# Patient Record
Sex: Male | Born: 1945 | Race: White | Hispanic: No | Marital: Single | Smoking: Former smoker
Health system: Southern US, Community
[De-identification: ages and names within clinical notes are randomized; demographics above are authoritative.]

## PROBLEM LIST (undated history)

## (undated) DIAGNOSIS — I4891 Unspecified atrial fibrillation: Secondary | ICD-10-CM

## (undated) DIAGNOSIS — I499 Cardiac arrhythmia, unspecified: Secondary | ICD-10-CM

## (undated) DIAGNOSIS — I2510 Atherosclerotic heart disease of native coronary artery without angina pectoris: Secondary | ICD-10-CM

## (undated) DIAGNOSIS — I429 Cardiomyopathy, unspecified: Secondary | ICD-10-CM

## (undated) DIAGNOSIS — I739 Peripheral vascular disease, unspecified: Secondary | ICD-10-CM

## (undated) DIAGNOSIS — E785 Hyperlipidemia, unspecified: Secondary | ICD-10-CM

## (undated) DIAGNOSIS — I10 Essential (primary) hypertension: Secondary | ICD-10-CM

## (undated) DIAGNOSIS — Z9889 Other specified postprocedural states: Secondary | ICD-10-CM

## (undated) DIAGNOSIS — I340 Nonrheumatic mitral (valve) insufficiency: Secondary | ICD-10-CM

## (undated) HISTORY — DX: Unspecified atrial fibrillation: I48.91

## (undated) HISTORY — PX: REPAIR QUADRICEPS / HAMSTRING MUSCLE: SUR1204

## (undated) HISTORY — DX: Atherosclerotic heart disease of native coronary artery without angina pectoris: I25.10

## (undated) HISTORY — DX: Peripheral vascular disease, unspecified: I73.9

## (undated) HISTORY — PX: HERNIA REPAIR: SHX51

## (undated) HISTORY — DX: Nonrheumatic mitral (valve) insufficiency: I34.0

## (undated) HISTORY — DX: Cardiomyopathy, unspecified: I42.9

## (undated) HISTORY — PX: CARDIAC CATHETERIZATION: SHX172

## (undated) HISTORY — DX: Other specified postprocedural states: Z98.890

## (undated) HISTORY — DX: Hyperlipidemia, unspecified: E78.5

---

## 2016-07-30 DIAGNOSIS — Z9889 Other specified postprocedural states: Secondary | ICD-10-CM

## 2016-07-30 DIAGNOSIS — I340 Nonrheumatic mitral (valve) insufficiency: Secondary | ICD-10-CM

## 2016-07-30 HISTORY — DX: Other specified postprocedural states: Z98.890

## 2016-07-30 HISTORY — DX: Nonrheumatic mitral (valve) insufficiency: I34.0

## 2016-08-11 ENCOUNTER — Encounter: Payer: Self-pay | Admitting: Cardiology

## 2016-08-11 ENCOUNTER — Encounter: Payer: Self-pay | Admitting: Thoracic Surgery (Cardiothoracic Vascular Surgery)

## 2016-08-14 ENCOUNTER — Encounter: Payer: Self-pay | Admitting: *Deleted

## 2016-08-14 ENCOUNTER — Institutional Professional Consult (permissible substitution) (INDEPENDENT_AMBULATORY_CARE_PROVIDER_SITE_OTHER): Payer: Medicare Other | Admitting: Thoracic Surgery (Cardiothoracic Vascular Surgery)

## 2016-08-14 ENCOUNTER — Encounter: Payer: Self-pay | Admitting: Thoracic Surgery (Cardiothoracic Vascular Surgery)

## 2016-08-14 VITALS — BP 128/86 | HR 116 | Resp 16 | Ht 73.0 in | Wt 213.0 lb

## 2016-08-14 DIAGNOSIS — I739 Peripheral vascular disease, unspecified: Secondary | ICD-10-CM

## 2016-08-14 DIAGNOSIS — E785 Hyperlipidemia, unspecified: Secondary | ICD-10-CM | POA: Insufficient documentation

## 2016-08-14 DIAGNOSIS — I429 Cardiomyopathy, unspecified: Secondary | ICD-10-CM | POA: Insufficient documentation

## 2016-08-14 DIAGNOSIS — I2510 Atherosclerotic heart disease of native coronary artery without angina pectoris: Secondary | ICD-10-CM

## 2016-08-14 DIAGNOSIS — I340 Nonrheumatic mitral (valve) insufficiency: Secondary | ICD-10-CM

## 2016-08-14 DIAGNOSIS — I10 Essential (primary) hypertension: Secondary | ICD-10-CM | POA: Insufficient documentation

## 2016-08-14 DIAGNOSIS — I5022 Chronic systolic (congestive) heart failure: Secondary | ICD-10-CM

## 2016-08-14 DIAGNOSIS — I4819 Other persistent atrial fibrillation: Secondary | ICD-10-CM | POA: Insufficient documentation

## 2016-08-14 NOTE — Patient Instructions (Signed)
Stop plavix 5 days prior to surgery  Start 1 baby aspirin (81 mg) daily when you stop plavix  Stop Eliquis (apixaban) 2 days prior to surgery

## 2016-08-14 NOTE — Progress Notes (Signed)
PCP is Kristen Loader, Tor Netters., MD Referring Provider is Caryl Ada, MD  Chief Complaint  Patient presents with  . Coronary Artery Disease    Surgical eval, Cardiac Cath and ECHO 07/30/2016 @ [facility]    HPI: [name] Green is referred for evaluation of coronary artery disease.  [name] Green is a 71 year old gentleman with no prior cardiac history. He does have a history of hypertension, hyperlipidemia and tobacco abuse. He also has known peripheral arterial disease and has had a stent placed in his left leg by Dr. Sondra Come in [facility].  He was in his usual state of health until early 2018 when he started noticing shortness of breath with exertion. He got to the point that even walking to his mailbox he would get severely short of breath like he had just run a race. In May he went to the hospital in [place] with severe shortness of breath. He was found to be in atrial flutter with a rapid ventricular response. His troponin was mildly elevated at 0.1. He was given metoprolol and Lasix and apparently signed out AMA at that time. He was sent to Dr. Red Christians for further evaluation. He was started on Eliquis and his metoprolol was increased to 50 mg daily. His symptoms improved after that. He still has shortness of breath on exertion, but can walk half a mile before it occurs.  He had an echocardiogram on 07/30/2016 which showed moderate to severe mitral regurgitation with thickened mitral valve leaflets. Ejection fraction was approximately 30% with inferior akinesis and global hypokinesis. Cardiac catheterization on the same day revealed severe two-vessel coronary disease with an 80% LAD stenosis in the region of the take off 2 diagonal branches and a 70% stenosis in the second diagonal. There was subtotal occlusion of the right coronary with only a small posterior descending visible distally.  He and his family. Her different opinions on bypass surgery versus stenting in [place] and wished to come for a second  opinion.   Past Medical History:  Diagnosis Date  . A-fib (HCC)    persistent  . CAD, multiple vessel   . Cardiomyopathy (HCC)   . Hyperlipidemia   . Mitral regurgitation 07/30/2016   ECHO  . PAD (peripheral artery disease) (HCC)    FOLLOWED BY DR. Sondra Come, ON PLAVIX  . S/P cardiac cath 07/30/2016    No past surgical history on file. Hernia repair Stent in left leg artery  No family history on file. Father died of cancer Mother stroke Brother stroke  Social History Social History  Substance Use Topics  . Smoking status: Former Smoker    Packs/day: 1.00    Years: 55.00    Types: Cigarettes  . Smokeless tobacco: Never Used  . Alcohol use Not on file    Current Outpatient Prescriptions  Medication Sig Dispense Refill  . amLODipine (NORVASC) 5 MG tablet Take 5 mg by mouth daily.    Marland Kitchen apixaban (ELIQUIS) 5 MG TABS tablet Take 5 mg by mouth 2 (two) times daily.    Marland Kitchen atorvastatin (LIPITOR) 40 MG tablet Take 40 mg by mouth daily.    . clopidogrel (PLAVIX) 75 MG tablet Take 75 mg by mouth daily.    Marland Kitchen KRILL OIL PO Take 500 mg by mouth daily.     Marland Kitchen lisinopril (PRINIVIL,ZESTRIL) 10 MG tablet Take 10 mg by mouth daily.    . metoprolol succinate (TOPROL-XL) 50 MG 24 hr tablet Take 50 mg by mouth daily. Take with or immediately following  a meal.     No current facility-administered medications for this visit.     No Known Allergies  Review of Systems  Constitutional: Positive for fatigue. Negative for activity change and unexpected weight change.  HENT: Negative for dental problem, trouble swallowing and voice change.   Eyes: Negative for visual disturbance.  Respiratory: Positive for shortness of breath. Negative for chest tightness.   Cardiovascular: Negative for chest pain, palpitations and leg swelling.       Varicose veins right leg  Gastrointestinal: Negative for abdominal pain and blood in stool.  Genitourinary: Negative for difficulty urinating and dysuria.   Musculoskeletal: Negative for arthralgias and myalgias.  Neurological: Negative for dizziness, syncope and weakness.  Hematological: Bruises/bleeds easily.  All other systems reviewed and are negative.   BP 128/86 (BP Location: Right Arm, Patient Position: Sitting, Cuff Size: Large)   Pulse (!) 116   Resp 16   Ht 6\' 1"  (1.854 m)   Wt 213 lb (96.6 kg)   SpO2 98% Comment: RA  BMI 28.10 kg/m  Physical Exam  Constitutional: He is oriented to person, place, and time. He appears well-developed and well-nourished. No distress.  HENT:  Head: Normocephalic and atraumatic.  Mouth/Throat: No oropharyngeal exudate.  Eyes: Conjunctivae and EOM are normal. No scleral icterus.  Neck: Neck supple. No thyromegaly present.  Cardiovascular: Exam reveals no friction rub.   Murmur (Faint systolic) heard. Irregularly irregular  Pulmonary/Chest: Effort normal and breath sounds normal. No respiratory distress. He has no wheezes. He has no rales.  Abdominal: Soft. He exhibits no distension. There is no tenderness.  Musculoskeletal: He exhibits no edema.  Varicosities right thigh  Lymphadenopathy:    He has no cervical adenopathy.  Neurological: He is alert and oriented to person, place, and time. No cranial nerve deficit. He exhibits normal muscle tone.  Motor 5 out of 5 all extremities  Skin: Skin is warm and dry.  Vitals reviewed.    Diagnostic Tests: I personally reviewed the cardiac catheterization and echocardiogram images and concur with the official reports from Dr. Red Christianshui as noted below  Cardiac catheterization 1. Severe RCA/LAD lesions, LAD bifurcating with moderate size diagonal which also has a lesion and it. 2. Moderate global LV dysfunction  LAD 80% stenosis in mid LAD 70% stenosis second diagonal RCA subtotal occlusion  Echocardiogram Left ventricular cavity size is normal. Moderate global LV hypokinesis with inferior basal wall akinesis Ejection fraction is visually estimated  at 30% Moderate to severe mitral regurgitation Normal right ventricular structure and function  Also noted moderate thickening of mitral valve leaflets  Impression: [name] Green is a 71 year old man with multiple cardiac risk factors who presented with shortness of breath on exertion. Workup revealed severe two-vessel coronary disease with ischemic cardiomyopathy, mitral regurgitation, and atrial flutter with a rapid ventricular response. His symptoms, but not completely resolved, have improved with medical therapy.  He has gone from class IV to class II symptoms.  Options include medical therapy, limited revascularization with angioplasty, or surgical treatment. Should he opt for surgery I think there is a high probability he would need the mitral valve addressed at the time of the procedure. I also would recommend doing a Maze procedure if he were to undergo surgical revascularization.  I described the proposed surgical procedure of coronary bypass grafting, possible mitral valve repair, maze procedure, possible left radial artery harvest to [name] Green and his family. I informed him of the general nature of the procedure, the need for general anesthesia,  the use of cardioplegic bypass, the expected hospital stay, and the overall recovery. They understand this is a major operation with potential for major morbidity or mortality. I informed him of the indications, risks, benefits, and alternatives. They understand the risk include, but are not limited to death, MI, DVT, PE, bleeding, possible need for transfusion, infection, cardiac arrhythmias, heart block, respiratory or renal failure, as well as the possibility of other unforeseeable complications. They understand that Maze procedure as effective approximately 85% of the time for treatment of atrial fibrillation. He will need to remain on anticoagulation in the near term.  They are anxious to proceed with surgical revascularization and correction of mitral  regurgitation and atrial fibrillation.  Plan: See dentist for dental evaluation Stop Plavix 5 days prior to surgery and start aspirin 81 mg a day Stop Eliquis 2 days prior to surgery Coronary bypass graft 3, possible left radial artery harvest, mitral valve repair, Maze procedure on Wednesday, 09/17/2016  Loreli SlotSteven C Effrey Davidow, MD [facility] [phone]  Addendum He also was seen [facility] within the past couple of days for an abdominal ultrasound to evaluate elevated liver function testing. There was a question of 2 small hypoechoic areas in the pancreas. He did not inform you that while he was here his visit and I found those records through care everywhere.

## 2016-08-15 ENCOUNTER — Other Ambulatory Visit: Payer: Self-pay | Admitting: *Deleted

## 2016-08-15 DIAGNOSIS — I340 Nonrheumatic mitral (valve) insufficiency: Secondary | ICD-10-CM

## 2016-08-15 DIAGNOSIS — I2510 Atherosclerotic heart disease of native coronary artery without angina pectoris: Secondary | ICD-10-CM

## 2016-08-15 DIAGNOSIS — I4819 Other persistent atrial fibrillation: Secondary | ICD-10-CM

## 2016-08-26 ENCOUNTER — Telehealth: Payer: Self-pay

## 2016-08-26 NOTE — Telephone Encounter (Signed)
Mr [name] Green is scheduled to have 2 teeth extracted next week and was needing to know about when to hold his blood thinners. I recommended that he call the prescribing MD for that information. He agreed and will call Dr Ninetta LightsSara Furr

## 2016-09-12 ENCOUNTER — Ambulatory Visit (HOSPITAL_COMMUNITY)
Admission: RE | Admit: 2016-09-12 | Discharge: 2016-09-12 | Disposition: A | Discharge status: Home / Self Care | Payer: Medicare Other | Source: Ambulatory Visit | Attending: Thoracic Surgery (Cardiothoracic Vascular Surgery) | Admitting: Thoracic Surgery (Cardiothoracic Vascular Surgery)

## 2016-09-12 ENCOUNTER — Encounter (HOSPITAL_COMMUNITY)
Admission: RE | Admit: 2016-09-12 | Discharge: 2016-09-12 | Disposition: A | Discharge status: Home / Self Care | Payer: Medicare Other | Source: Ambulatory Visit | Attending: Thoracic Surgery (Cardiothoracic Vascular Surgery) | Admitting: Thoracic Surgery (Cardiothoracic Vascular Surgery)

## 2016-09-12 ENCOUNTER — Encounter (HOSPITAL_COMMUNITY): Payer: Self-pay

## 2016-09-12 DIAGNOSIS — I10 Essential (primary) hypertension: Secondary | ICD-10-CM | POA: Insufficient documentation

## 2016-09-12 DIAGNOSIS — I4819 Other persistent atrial fibrillation: Secondary | ICD-10-CM

## 2016-09-12 DIAGNOSIS — I2510 Atherosclerotic heart disease of native coronary artery without angina pectoris: Secondary | ICD-10-CM | POA: Insufficient documentation

## 2016-09-12 DIAGNOSIS — Z951 Presence of aortocoronary bypass graft: Secondary | ICD-10-CM | POA: Diagnosis not present

## 2016-09-12 DIAGNOSIS — R918 Other nonspecific abnormal finding of lung field: Secondary | ICD-10-CM | POA: Insufficient documentation

## 2016-09-12 DIAGNOSIS — I481 Persistent atrial fibrillation: Secondary | ICD-10-CM | POA: Insufficient documentation

## 2016-09-12 DIAGNOSIS — I340 Nonrheumatic mitral (valve) insufficiency: Secondary | ICD-10-CM

## 2016-09-12 HISTORY — DX: Cardiac arrhythmia, unspecified: I49.9

## 2016-09-12 HISTORY — DX: Essential (primary) hypertension: I10

## 2016-09-12 LAB — HEMOGLOBIN A1C
HEMOGLOBIN A1C: 6.1 % — AB (ref 4.8–5.6)
Mean Plasma Glucose: 128.37 mg/dL

## 2016-09-12 LAB — VAS US DOPPLER PRE CABG
LCCADDIAS: 15 cm/s
LCCAPDIAS: 32 cm/s
LCCAPSYS: 104 cm/s
LEFT ECA DIAS: -27 cm/s
LEFT VERTEBRAL DIAS: -18 cm/s
LICADDIAS: -40 cm/s
LICADSYS: -86 cm/s
LICAPSYS: -47 cm/s
Left CCA dist sys: 50 cm/s
Left ICA prox dias: -23 cm/s
RIGHT ECA DIAS: -25 cm/s
RIGHT VERTEBRAL DIAS: -25 cm/s
Right CCA prox dias: 24 cm/s
Right CCA prox sys: 72 cm/s
Right cca dist sys: -79 cm/s

## 2016-09-12 LAB — CBC
HEMATOCRIT: 42.2 % (ref 39.0–52.0)
Hemoglobin: 13.2 g/dL (ref 13.0–17.0)
MCH: 26.9 pg (ref 26.0–34.0)
MCHC: 31.3 g/dL (ref 30.0–36.0)
MCV: 86.1 fL (ref 78.0–100.0)
PLATELETS: 152 10*3/uL (ref 150–400)
RBC: 4.9 MIL/uL (ref 4.22–5.81)
RDW: 15.3 % (ref 11.5–15.5)
WBC: 5.7 10*3/uL (ref 4.0–10.5)

## 2016-09-12 LAB — PULMONARY FUNCTION TEST
DL/VA % PRED: 74 %
DL/VA: 3.54 ml/min/mmHg/L
DLCO UNC % PRED: 49 %
DLCO unc: 18.18 ml/min/mmHg
FEF 25-75 POST: 0.95 L/s
FEF 25-75 PRE: 0.97 L/s
FEF2575-%Change-Post: -2 %
FEF2575-%PRED-PRE: 37 %
FEF2575-%Pred-Post: 36 %
FEV1-%CHANGE-POST: 0 %
FEV1-%Pred-Post: 52 %
FEV1-%Pred-Pre: 52 %
FEV1-Post: 1.85 L
FEV1-Pre: 1.85 L
FEV1FVC-%Change-Post: -1 %
FEV1FVC-%PRED-PRE: 84 %
FEV6-%CHANGE-POST: 1 %
FEV6-%PRED-POST: 64 %
FEV6-%Pred-Pre: 63 %
FEV6-Post: 2.92 L
FEV6-Pre: 2.89 L
FEV6FVC-%CHANGE-POST: 0 %
FEV6FVC-%Pred-Post: 102 %
FEV6FVC-%Pred-Pre: 103 %
FVC-%Change-Post: 1 %
FVC-%PRED-POST: 62 %
FVC-%Pred-Pre: 61 %
FVC-Post: 3.03 L
FVC-Pre: 2.98 L
Post FEV1/FVC ratio: 61 %
Post FEV6/FVC ratio: 96 %
Pre FEV1/FVC ratio: 62 %
Pre FEV6/FVC Ratio: 97 %
RV % pred: 131 %
RV: 3.53 L
TLC % pred: 85 %
TLC: 6.53 L

## 2016-09-12 LAB — URINALYSIS, ROUTINE W REFLEX MICROSCOPIC
BILIRUBIN URINE: NEGATIVE
GLUCOSE, UA: NEGATIVE mg/dL
HGB URINE DIPSTICK: NEGATIVE
KETONES UR: NEGATIVE mg/dL
Leukocytes, UA: NEGATIVE
Nitrite: NEGATIVE
PROTEIN: NEGATIVE mg/dL
Specific Gravity, Urine: 1.012 (ref 1.005–1.030)
pH: 6 (ref 5.0–8.0)

## 2016-09-12 LAB — COMPREHENSIVE METABOLIC PANEL
ALBUMIN: 3.9 g/dL (ref 3.5–5.0)
ALT: 30 U/L (ref 17–63)
AST: 31 U/L (ref 15–41)
Alkaline Phosphatase: 93 U/L (ref 38–126)
Anion gap: 9 (ref 5–15)
BUN: 17 mg/dL (ref 6–20)
CHLORIDE: 106 mmol/L (ref 101–111)
CO2: 24 mmol/L (ref 22–32)
CREATININE: 1.11 mg/dL (ref 0.61–1.24)
Calcium: 9.2 mg/dL (ref 8.9–10.3)
GFR calc Af Amer: >60 mL/min (ref >60)
GLUCOSE: 91 mg/dL (ref 65–99)
Potassium: 4 mmol/L (ref 3.5–5.1)
Sodium: 139 mmol/L (ref 135–145)
Total Bilirubin: 1.1 mg/dL (ref 0.3–1.2)
Total Protein: 6.5 g/dL (ref 6.5–8.1)

## 2016-09-12 LAB — BLOOD GAS, ARTERIAL
Acid-Base Excess: 0.5 mmol/L (ref 0.0–2.0)
Bicarbonate: 24.5 mmol/L (ref 20.0–28.0)
DRAWN BY: 470591
FIO2: 21
O2 SAT: 97.1 %
PCO2 ART: 38.7 mmHg (ref 32.0–48.0)
PH ART: 7.417 (ref 7.350–7.450)
Patient temperature: 98.6
pO2, Arterial: 88.8 mmHg (ref 83.0–108.0)

## 2016-09-12 LAB — ABO/RH: ABO/RH(D): O NEG

## 2016-09-12 LAB — TYPE AND SCREEN
ABO/RH(D): O NEG
ANTIBODY SCREEN: NEGATIVE

## 2016-09-12 LAB — APTT: aPTT: 42 seconds — ABNORMAL HIGH (ref 24–36)

## 2016-09-12 LAB — SURGICAL PCR SCREEN
MRSA, PCR: NEGATIVE
Staphylococcus aureus: NEGATIVE

## 2016-09-12 LAB — PROTIME-INR
INR: 1.24
Prothrombin Time: 15.5 seconds — ABNORMAL HIGH (ref 11.4–15.2)

## 2016-09-12 MED ORDER — ALBUTEROL SULFATE (2.5 MG/3ML) 0.083% IN NEBU
2.5000 mg | INHALATION_SOLUTION | Freq: Once | RESPIRATORY_TRACT | Status: AC
  Administered 2016-09-12: 2.5 mg via RESPIRATORY_TRACT

## 2016-09-12 NOTE — Pre-Procedure Instructions (Signed)
[name] Green  09/12/2016      [id number] - [place], [place] - [address] [facility] [id number] Phone: [phone] Fax: [phone]    Your procedure is scheduled on Sept 5  Report to [facility] Admitting at 630 A.M.  Call this number if you have problems the morning of surgery:  [phone]   Remember:  Do not eat food or drink liquids after midnight.  Take these medicines the morning of surgery with A SIP OF WATER amlodipine (Norvasc), Metoprolol succinate (Toprol-XL)  Stop Eliquis and Plavix as directed by your Dr.  Stop taking BC's, Goody's, Herbal medications, Fish Oil, Vitamins, Ibuprofen, Advil, Motrin, aleve   Do not wear jewelry, make-up or nail polish.  Do not wear lotions, powders, or perfumes, or deoderant.  Do not shave 48 hours prior to surgery.  Men may shave face and neck.  Do not bring valuables to the hospital.  [facility] is not responsible for any belongings or valuables.  Contacts, dentures or bridgework may not be worn into surgery.  Leave your suitcase in the car.  After surgery it may be brought to your room.  For patients admitted to the hospital, discharge time will be determined by your treatment team.  Patients discharged the day of surgery will not be allowed to drive home.    Special instructions:  [place] - Preparing for Surgery  Before surgery, you can play an important role.  Because skin is not sterile, your skin needs to be as free of germs as possible.  You can reduce the number of germs on you skin by washing with CHG (chlorahexidine gluconate) soap before surgery.  CHG is an antiseptic cleaner which kills germs and bonds with the skin to continue killing germs even after washing.  Please DO NOT use if you have an allergy to CHG or antibacterial soaps.  If your skin becomes reddened/irritated stop using the CHG and inform your nurse when you arrive at Short Stay.  Do not shave  (including legs and underarms) for at least 48 hours prior to the first CHG shower.  You may shave your face.  Please follow these instructions carefully:   1.  Shower with CHG Soap the night before surgery and the   morning of Surgery.  2.  If you choose to wash your hair, wash your hair first as usual with your  normal shampoo.  3.  After you shampoo, rinse your hair and body thoroughly to remove the Shampoo.  4.  Use CHG as you would any other liquid soap.  You can apply chg directly to the skin and wash gently with scrungie or a clean washcloth.  5.  Apply the CHG Soap to your body ONLY FROM THE NECK DOWN.  Do not use on open wounds or open sores.  Avoid contact with your eyes,  ears, mouth and genitals (private parts).  Wash genitals (private parts)  with your normal soap.  6.  Wash thoroughly, paying special attention to the area where your surgery  will be performed.  7.  Thoroughly rinse your body with warm water from the neck down.  8.  DO NOT shower/wash with your normal soap after using and rinsing off  the CHG Soap.  9.  Pat yourself dry with a clean towel.            10.  Wear clean pajamas.  11.  Place clean sheets on your bed the night of your first shower and do not  sleep with pets.  Day of Surgery  Do not apply any lotions/deoderants the morning of surgery.  Please wear clean clothes to the hospital/surgery center.     Please read over the following fact sheets that you were given. Pain Booklet, Coughing and Deep Breathing, MRSA Information and Surgical Site Infection Prevention

## 2016-09-12 NOTE — Progress Notes (Signed)
Pre-op Cardiac Surgery  Carotid Findings:  Bilateral 1-39% ICA stenosis, antegrade vertebral flow.   Upper Extremity Right Left  Brachial Pressures 132, Tri 129, Tri  Radial Waveforms Tri Tri  Ulnar Waveforms Tri Tri  Palmar Arch (Allen's Test) waveform decreases greater than 50% with radial and ulnar compression. waveform increases greater than 50% with radial compression and reverses with ulnar compression   Lower  Extremity Right Left  Dorsalis Pedis 133, Bi 119, Bi  Posterior Tibial 141, Bi 130, Bi  Ankle/Brachial Indices 1.07 0.98   Levin BaconClaire Kevaughn Green- RDMS, RVT 10:38 AM  09/12/2016

## 2016-09-12 NOTE — Progress Notes (Addendum)
PCP is Dr. Ninetta LightsSara Furr Cardiologist is [facility]  Denies any chest pain, fever, or cough. Echo 08-15-16 Card cath 07-30-16 Denies ever having a stress test.  Reports his last dose of plavix will be 09-13-16 Last dose of Eliquis will be 09-15-16 He will start taking ASA as directed once he stops Plavix.

## 2016-09-12 NOTE — Progress Notes (Addendum)
Message left with Alycia RossettiRyan to inform her of PTT of 42. Alycia RossettiRyan also informed of CXR report.

## 2016-09-16 MED ORDER — VANCOMYCIN HCL 10 G IV SOLR
1500.0000 mg | INTRAVENOUS | Status: AC
  Administered 2016-09-17: 1500 mg via INTRAVENOUS
  Filled 2016-09-16: qty 1500

## 2016-09-16 MED ORDER — SODIUM CHLORIDE 0.9 % IV SOLN
INTRAVENOUS | Status: DC
  Filled 2016-09-16: qty 30

## 2016-09-16 MED ORDER — CEFUROXIME SODIUM 750 MG IJ SOLR
750.0000 mg | INTRAMUSCULAR | Status: DC
  Filled 2016-09-16: qty 750

## 2016-09-16 MED ORDER — DEXTROSE 5 % IV SOLN
1.5000 g | INTRAVENOUS | Status: AC
  Administered 2016-09-17: 1.5 g via INTRAVENOUS
  Administered 2016-09-17: .75 g via INTRAVENOUS
  Filled 2016-09-16: qty 1.5

## 2016-09-16 MED ORDER — DOPAMINE-DEXTROSE 3.2-5 MG/ML-% IV SOLN
0.0000 ug/kg/min | INTRAVENOUS | Status: AC
  Administered 2016-09-17: 3 ug/kg/min via INTRAVENOUS
  Filled 2016-09-16: qty 250

## 2016-09-16 MED ORDER — POTASSIUM CHLORIDE 2 MEQ/ML IV SOLN
80.0000 meq | INTRAVENOUS | Status: DC
  Filled 2016-09-16: qty 40

## 2016-09-16 MED ORDER — PLASMA-LYTE 148 IV SOLN
INTRAVENOUS | Status: AC
  Administered 2016-09-17: 500 mL
  Filled 2016-09-16: qty 2.5

## 2016-09-16 MED ORDER — SODIUM CHLORIDE 0.9 % IV SOLN
INTRAVENOUS | Status: AC
  Administered 2016-09-17: 1 [IU]/h via INTRAVENOUS
  Filled 2016-09-16: qty 1

## 2016-09-16 MED ORDER — TRANEXAMIC ACID 1000 MG/10ML IV SOLN
1.5000 mg/kg/h | INTRAVENOUS | Status: AC
  Administered 2016-09-17: 1.5 mg/kg/h via INTRAVENOUS
  Filled 2016-09-16: qty 25

## 2016-09-16 MED ORDER — DEXMEDETOMIDINE HCL IN NACL 400 MCG/100ML IV SOLN
0.1000 ug/kg/h | INTRAVENOUS | Status: AC
  Administered 2016-09-17: .3 ug/kg/h via INTRAVENOUS
  Filled 2016-09-16: qty 100

## 2016-09-16 MED ORDER — PHENYLEPHRINE HCL 10 MG/ML IJ SOLN
30.0000 ug/min | INTRAMUSCULAR | Status: AC
  Administered 2016-09-17: 20 ug/min via INTRAVENOUS
  Filled 2016-09-16: qty 2

## 2016-09-16 MED ORDER — TRANEXAMIC ACID (OHS) PUMP PRIME SOLUTION
2.0000 mg/kg | INTRAVENOUS | Status: DC
  Filled 2016-09-16: qty 1.91

## 2016-09-16 MED ORDER — DEXTROSE 5 % IV SOLN
0.0000 ug/min | INTRAVENOUS | Status: DC
  Filled 2016-09-16: qty 4

## 2016-09-16 MED ORDER — MAGNESIUM SULFATE 50 % IJ SOLN
40.0000 meq | INTRAMUSCULAR | Status: DC
  Filled 2016-09-16: qty 10

## 2016-09-16 MED ORDER — TRANEXAMIC ACID (OHS) BOLUS VIA INFUSION
15.0000 mg/kg | INTRAVENOUS | Status: AC
  Administered 2016-09-17: 1429.5 mg via INTRAVENOUS
  Filled 2016-09-16: qty 1430

## 2016-09-16 MED ORDER — NITROGLYCERIN IN D5W 200-5 MCG/ML-% IV SOLN
2.0000 ug/min | INTRAVENOUS | Status: AC
  Administered 2016-09-17: 5 ug/min via INTRAVENOUS
  Filled 2016-09-16: qty 250

## 2016-09-16 MED ORDER — METOPROLOL TARTRATE 12.5 MG HALF TABLET: 12.5000 mg | ORAL_TABLET | Freq: Once | ORAL | Status: DC

## 2016-09-17 ENCOUNTER — Inpatient Hospital Stay (HOSPITAL_COMMUNITY): Payer: Medicare Other | Admitting: Certified Registered Nurse Anesthetist

## 2016-09-17 ENCOUNTER — Encounter (HOSPITAL_COMMUNITY): Payer: Self-pay | Admitting: *Deleted

## 2016-09-17 ENCOUNTER — Inpatient Hospital Stay (HOSPITAL_COMMUNITY): Payer: Medicare Other

## 2016-09-17 ENCOUNTER — Inpatient Hospital Stay (HOSPITAL_COMMUNITY)
Admission: RE | Admit: 2016-09-17 | Discharge: 2016-09-24 | DRG: 220 | Disposition: A | Discharge status: Home / Self Care | Payer: Medicare Other | Source: Ambulatory Visit | Attending: Thoracic Surgery (Cardiothoracic Vascular Surgery) | Admitting: Thoracic Surgery (Cardiothoracic Vascular Surgery)

## 2016-09-17 ENCOUNTER — Inpatient Hospital Stay (HOSPITAL_COMMUNITY): Payer: Medicare Other | Admitting: Vascular Surgery

## 2016-09-17 ENCOUNTER — Inpatient Hospital Stay (HOSPITAL_COMMUNITY)
Admission: RE | Disposition: A | Discharge status: Home / Self Care | Payer: Self-pay | Source: Ambulatory Visit | Attending: Thoracic Surgery (Cardiothoracic Vascular Surgery)

## 2016-09-17 DIAGNOSIS — D696 Thrombocytopenia, unspecified: Secondary | ICD-10-CM | POA: Diagnosis not present

## 2016-09-17 DIAGNOSIS — Z823 Family history of stroke: Secondary | ICD-10-CM

## 2016-09-17 DIAGNOSIS — I255 Ischemic cardiomyopathy: Secondary | ICD-10-CM | POA: Diagnosis not present

## 2016-09-17 DIAGNOSIS — Z7901 Long term (current) use of anticoagulants: Secondary | ICD-10-CM

## 2016-09-17 DIAGNOSIS — I5022 Chronic systolic (congestive) heart failure: Secondary | ICD-10-CM | POA: Diagnosis present

## 2016-09-17 DIAGNOSIS — I2510 Atherosclerotic heart disease of native coronary artery without angina pectoris: Secondary | ICD-10-CM

## 2016-09-17 DIAGNOSIS — Z9889 Other specified postprocedural states: Secondary | ICD-10-CM

## 2016-09-17 DIAGNOSIS — I340 Nonrheumatic mitral (valve) insufficiency: Secondary | ICD-10-CM | POA: Diagnosis not present

## 2016-09-17 DIAGNOSIS — R112 Nausea with vomiting, unspecified: Secondary | ICD-10-CM | POA: Diagnosis not present

## 2016-09-17 DIAGNOSIS — Z87891 Personal history of nicotine dependence: Secondary | ICD-10-CM

## 2016-09-17 DIAGNOSIS — Z951 Presence of aortocoronary bypass graft: Secondary | ICD-10-CM

## 2016-09-17 DIAGNOSIS — I480 Paroxysmal atrial fibrillation: Secondary | ICD-10-CM | POA: Diagnosis not present

## 2016-09-17 DIAGNOSIS — I4819 Other persistent atrial fibrillation: Secondary | ICD-10-CM

## 2016-09-17 DIAGNOSIS — J9811 Atelectasis: Secondary | ICD-10-CM

## 2016-09-17 DIAGNOSIS — I110 Hypertensive heart disease with heart failure: Secondary | ICD-10-CM | POA: Diagnosis not present

## 2016-09-17 DIAGNOSIS — Z7902 Long term (current) use of antithrombotics/antiplatelets: Secondary | ICD-10-CM | POA: Diagnosis not present

## 2016-09-17 DIAGNOSIS — E785 Hyperlipidemia, unspecified: Secondary | ICD-10-CM | POA: Diagnosis present

## 2016-09-17 DIAGNOSIS — I481 Persistent atrial fibrillation: Secondary | ICD-10-CM | POA: Diagnosis present

## 2016-09-17 HISTORY — PX: CORONARY ARTERY BYPASS GRAFT: SHX141

## 2016-09-17 HISTORY — PX: MAZE: SHX5063

## 2016-09-17 HISTORY — PX: MITRAL VALVE REPAIR: SHX2039

## 2016-09-17 HISTORY — PX: TEE WITHOUT CARDIOVERSION: SHX5443

## 2016-09-17 LAB — POCT I-STAT 3, ART BLOOD GAS (G3+)
ACID-BASE DEFICIT: 3 mmol/L — AB (ref 0.0–2.0)
ACID-BASE DEFICIT: 5 mmol/L — AB (ref 0.0–2.0)
ACID-BASE DEFICIT: 4 mmol/L — AB (ref 0.0–2.0)
ACID-BASE EXCESS: 4 mmol/L — AB (ref 0.0–2.0)
Acid-Base Excess: 1 mmol/L (ref 0.0–2.0)
Acid-base deficit: 5 mmol/L — ABNORMAL HIGH (ref 0.0–2.0)
Acid-base deficit: 5 mmol/L — ABNORMAL HIGH (ref 0.0–2.0)
Acid-base deficit: 5 mmol/L — ABNORMAL HIGH (ref 0.0–2.0)
BICARBONATE: 21.3 mmol/L (ref 20.0–28.0)
BICARBONATE: 22 mmol/L (ref 20.0–28.0)
BICARBONATE: 23.1 mmol/L (ref 20.0–28.0)
BICARBONATE: 29.7 mmol/L — AB (ref 20.0–28.0)
Bicarbonate: 27.7 mmol/L (ref 20.0–28.0)
Bicarbonate: 22.8 mmol/L (ref 20.0–28.0)
Bicarbonate: 22.3 mmol/L (ref 20.0–28.0)
Bicarbonate: 21.8 mmol/L (ref 20.0–28.0)
O2 SAT: 100 %
O2 SAT: 100 %
O2 SAT: 89 %
O2 SAT: 95 %
O2 SAT: 95 %
O2 SAT: 92 %
O2 Saturation: 87 %
O2 Saturation: 91 %
PCO2 ART: 42.4 mmHg (ref 32.0–48.0)
PCO2 ART: 42.6 mmHg (ref 32.0–48.0)
PCO2 ART: 43.7 mmHg (ref 32.0–48.0)
PCO2 ART: 50.6 mmHg — AB (ref 32.0–48.0)
PCO2 ART: 52.7 mmHg — AB (ref 32.0–48.0)
PCO2 ART: 54.8 mmHg — AB (ref 32.0–48.0)
PH ART: 7.312 — AB (ref 7.350–7.450)
PH ART: 7.257 — AB (ref 7.350–7.450)
PH ART: 7.287 — AB (ref 7.350–7.450)
PH ART: 7.322 — AB (ref 7.350–7.450)
PO2 ART: 447 mmHg — AB (ref 83.0–108.0)
PO2 ART: 78 mmHg — AB (ref 83.0–108.0)
PO2 ART: 80 mmHg — AB (ref 83.0–108.0)
PO2 ART: 68 mmHg — AB (ref 83.0–108.0)
Patient temperature: 36
Patient temperature: 36.3
Patient temperature: 36.4
Patient temperature: 36.8
Patient temperature: 37.1
TCO2: 23 mmol/L (ref 22–32)
TCO2: 23 mmol/L (ref 22–32)
TCO2: 23 mmol/L (ref 22–32)
TCO2: 24 mmol/L (ref 22–32)
TCO2: 24 mmol/L (ref 22–32)
TCO2: 24 mmol/L (ref 22–32)
TCO2: 29 mmol/L (ref 22–32)
TCO2: 31 mmol/L (ref 22–32)
pCO2 arterial: 46.3 mmHg (ref 32.0–48.0)
pCO2 arterial: 45.3 mmHg (ref 32.0–48.0)
pH, Arterial: 7.303 — ABNORMAL LOW (ref 7.350–7.450)
pH, Arterial: 7.308 — ABNORMAL LOW (ref 7.350–7.450)
pH, Arterial: 7.312 — ABNORMAL LOW (ref 7.350–7.450)
pH, Arterial: 7.359 (ref 7.350–7.450)
pO2, Arterial: 320 mmHg — ABNORMAL HIGH (ref 83.0–108.0)
pO2, Arterial: 59 mmHg — ABNORMAL LOW (ref 83.0–108.0)
pO2, Arterial: 61 mmHg — ABNORMAL LOW (ref 83.0–108.0)
pO2, Arterial: 67 mmHg — ABNORMAL LOW (ref 83.0–108.0)

## 2016-09-17 LAB — POCT I-STAT, CHEM 8
BUN: 22 mg/dL — AB (ref 6–20)
BUN: 21 mg/dL — AB (ref 6–20)
BUN: 21 mg/dL — AB (ref 6–20)
BUN: 21 mg/dL — ABNORMAL HIGH (ref 6–20)
BUN: 23 mg/dL — ABNORMAL HIGH (ref 6–20)
BUN: 23 mg/dL — ABNORMAL HIGH (ref 6–20)
BUN: 21 mg/dL — ABNORMAL HIGH (ref 6–20)
CALCIUM ION: 1.03 mmol/L — AB (ref 1.15–1.40)
CALCIUM ION: 1.11 mmol/L — AB (ref 1.15–1.40)
CALCIUM ION: 1.07 mmol/L — AB (ref 1.15–1.40)
CALCIUM ION: 1.11 mmol/L — AB (ref 1.15–1.40)
CALCIUM ION: 1.17 mmol/L (ref 1.15–1.40)
CHLORIDE: 106 mmol/L (ref 101–111)
CHLORIDE: 103 mmol/L (ref 101–111)
CHLORIDE: 106 mmol/L (ref 101–111)
CHLORIDE: 110 mmol/L (ref 101–111)
CREATININE: 0.9 mg/dL (ref 0.61–1.24)
CREATININE: 0.7 mg/dL (ref 0.61–1.24)
CREATININE: 0.8 mg/dL (ref 0.61–1.24)
CREATININE: 0.8 mg/dL (ref 0.61–1.24)
CREATININE: 1 mg/dL (ref 0.61–1.24)
Calcium, Ion: 1.18 mmol/L (ref 1.15–1.40)
Calcium, Ion: 1.23 mmol/L (ref 1.15–1.40)
Chloride: 106 mmol/L (ref 101–111)
Chloride: 105 mmol/L (ref 101–111)
Chloride: 106 mmol/L (ref 101–111)
Creatinine, Ser: 0.8 mg/dL (ref 0.61–1.24)
Creatinine, Ser: 0.8 mg/dL (ref 0.61–1.24)
GLUCOSE: 106 mg/dL — AB (ref 65–99)
GLUCOSE: 116 mg/dL — AB (ref 65–99)
GLUCOSE: 123 mg/dL — AB (ref 65–99)
GLUCOSE: 121 mg/dL — AB (ref 65–99)
GLUCOSE: 153 mg/dL — AB (ref 65–99)
Glucose, Bld: 102 mg/dL — ABNORMAL HIGH (ref 65–99)
Glucose, Bld: 147 mg/dL — ABNORMAL HIGH (ref 65–99)
HCT: 33 % — ABNORMAL LOW (ref 39.0–52.0)
HCT: 30 % — ABNORMAL LOW (ref 39.0–52.0)
HCT: 28 % — ABNORMAL LOW (ref 39.0–52.0)
HCT: 27 % — ABNORMAL LOW (ref 39.0–52.0)
HCT: 32 % — ABNORMAL LOW (ref 39.0–52.0)
HEMATOCRIT: 35 % — AB (ref 39.0–52.0)
HEMATOCRIT: 33 % — AB (ref 39.0–52.0)
HEMOGLOBIN: 10.2 g/dL — AB (ref 13.0–17.0)
HEMOGLOBIN: 9.5 g/dL — AB (ref 13.0–17.0)
Hemoglobin: 11.9 g/dL — ABNORMAL LOW (ref 13.0–17.0)
Hemoglobin: 11.2 g/dL — ABNORMAL LOW (ref 13.0–17.0)
Hemoglobin: 9.2 g/dL — ABNORMAL LOW (ref 13.0–17.0)
Hemoglobin: 10.9 g/dL — ABNORMAL LOW (ref 13.0–17.0)
Hemoglobin: 11.2 g/dL — ABNORMAL LOW (ref 13.0–17.0)
POTASSIUM: 5 mmol/L (ref 3.5–5.1)
POTASSIUM: 4.1 mmol/L (ref 3.5–5.1)
Potassium: 4 mmol/L (ref 3.5–5.1)
Potassium: 4.7 mmol/L (ref 3.5–5.1)
Potassium: 4.8 mmol/L (ref 3.5–5.1)
Potassium: 4.8 mmol/L (ref 3.5–5.1)
Potassium: 4 mmol/L (ref 3.5–5.1)
SODIUM: 141 mmol/L (ref 135–145)
SODIUM: 141 mmol/L (ref 135–145)
SODIUM: 139 mmol/L (ref 135–145)
SODIUM: 140 mmol/L (ref 135–145)
Sodium: 140 mmol/L (ref 135–145)
Sodium: 137 mmol/L (ref 135–145)
Sodium: 140 mmol/L (ref 135–145)
TCO2: 27 mmol/L (ref 22–32)
TCO2: 25 mmol/L (ref 22–32)
TCO2: 27 mmol/L (ref 22–32)
TCO2: 26 mmol/L (ref 22–32)
TCO2: 27 mmol/L (ref 22–32)
TCO2: 28 mmol/L (ref 22–32)
TCO2: 23 mmol/L (ref 22–32)

## 2016-09-17 LAB — HEMOGLOBIN AND HEMATOCRIT, BLOOD
HEMATOCRIT: 29.2 % — AB (ref 39.0–52.0)
HEMOGLOBIN: 9.4 g/dL — AB (ref 13.0–17.0)

## 2016-09-17 LAB — CBC
HEMATOCRIT: 39.7 % (ref 39.0–52.0)
HEMATOCRIT: 35.3 % — AB (ref 39.0–52.0)
HEMOGLOBIN: 12.5 g/dL — AB (ref 13.0–17.0)
HEMOGLOBIN: 11 g/dL — AB (ref 13.0–17.0)
MCH: 26.9 pg (ref 26.0–34.0)
MCH: 26.4 pg (ref 26.0–34.0)
MCHC: 31.5 g/dL (ref 30.0–36.0)
MCHC: 31.2 g/dL (ref 30.0–36.0)
MCV: 85.6 fL (ref 78.0–100.0)
MCV: 84.7 fL (ref 78.0–100.0)
PLATELETS: 136 10*3/uL — AB (ref 150–400)
Platelets: 123 10*3/uL — ABNORMAL LOW (ref 150–400)
RBC: 4.64 MIL/uL (ref 4.22–5.81)
RBC: 4.17 MIL/uL — AB (ref 4.22–5.81)
RDW: 15.3 % (ref 11.5–15.5)
RDW: 15.3 % (ref 11.5–15.5)
WBC: 16.1 10*3/uL — ABNORMAL HIGH (ref 4.0–10.5)
WBC: 14.8 10*3/uL — AB (ref 4.0–10.5)

## 2016-09-17 LAB — GLUCOSE, CAPILLARY
GLUCOSE-CAPILLARY: 132 mg/dL — AB (ref 65–99)
Glucose-Capillary: 139 mg/dL — ABNORMAL HIGH (ref 65–99)
Glucose-Capillary: 137 mg/dL — ABNORMAL HIGH (ref 65–99)
Glucose-Capillary: 124 mg/dL — ABNORMAL HIGH (ref 65–99)
Glucose-Capillary: 132 mg/dL — ABNORMAL HIGH (ref 65–99)

## 2016-09-17 LAB — POCT I-STAT 4, (NA,K, GLUC, HGB,HCT)
Glucose, Bld: 135 mg/dL — ABNORMAL HIGH (ref 65–99)
HCT: 39 % (ref 39.0–52.0)
HEMOGLOBIN: 13.3 g/dL (ref 13.0–17.0)
Potassium: 4.4 mmol/L (ref 3.5–5.1)
SODIUM: 141 mmol/L (ref 135–145)

## 2016-09-17 LAB — PROTIME-INR
INR: 0.99
INR: 1.34
PROTHROMBIN TIME: 16.5 s — AB (ref 11.4–15.2)
Prothrombin Time: 13 seconds (ref 11.4–15.2)

## 2016-09-17 LAB — APTT
APTT: 36 s (ref 24–36)
aPTT: 33 seconds (ref 24–36)

## 2016-09-17 LAB — MAGNESIUM: MAGNESIUM: 2.7 mg/dL — AB (ref 1.7–2.4)

## 2016-09-17 LAB — CK: CK TOTAL: 1184 U/L — AB (ref 49–397)

## 2016-09-17 LAB — PLATELET COUNT: PLATELETS: 105 10*3/uL — AB (ref 150–400)

## 2016-09-17 SURGERY — CORONARY ARTERY BYPASS GRAFTING (CABG)
Anesthesia: General | Site: Chest

## 2016-09-17 MED ORDER — VANCOMYCIN HCL IN DEXTROSE 1-5 GM/200ML-% IV SOLN
1000.0000 mg | Freq: Once | INTRAVENOUS | Status: AC
  Administered 2016-09-17: 1000 mg via INTRAVENOUS
  Filled 2016-09-17: qty 200

## 2016-09-17 MED ORDER — MIDAZOLAM HCL 2 MG/2ML IJ SOLN
2.0000 mg | INTRAMUSCULAR | Status: DC | PRN
  Administered 2016-09-17: 2 mg via INTRAVENOUS
  Filled 2016-09-17: qty 2

## 2016-09-17 MED ORDER — ARTIFICIAL TEARS OPHTHALMIC OINT
TOPICAL_OINTMENT | OPHTHALMIC | Status: AC
  Filled 2016-09-17: qty 3.5

## 2016-09-17 MED ORDER — SODIUM CHLORIDE 0.9 % IJ SOLN
OROMUCOSAL | Status: DC | PRN
  Administered 2016-09-17 (×3): 4 mL via TOPICAL

## 2016-09-17 MED ORDER — METOPROLOL TARTRATE 25 MG/10 ML ORAL SUSPENSION: 12.5000 mg | Freq: Two times a day (BID) | ORAL | Status: DC

## 2016-09-17 MED ORDER — AMIODARONE HCL IN DEXTROSE 360-4.14 MG/200ML-% IV SOLN
30.0000 mg/h | INTRAVENOUS | Status: DC
  Administered 2016-09-18 – 2016-09-19 (×3): 30 mg/h via INTRAVENOUS
  Filled 2016-09-17 (×3): qty 200

## 2016-09-17 MED ORDER — SODIUM CHLORIDE 0.9 % IV SOLN
INTRAVENOUS | Status: DC
  Filled 2016-09-17: qty 1

## 2016-09-17 MED ORDER — LACTATED RINGERS IV SOLN: INTRAVENOUS | Status: DC

## 2016-09-17 MED ORDER — SODIUM CHLORIDE 0.9% FLUSH: 3.0000 mL | INTRAVENOUS | Status: DC | PRN

## 2016-09-17 MED ORDER — PROTAMINE SULFATE 10 MG/ML IV SOLN
INTRAVENOUS | Status: DC | PRN
  Administered 2016-09-17: 360 mg via INTRAVENOUS

## 2016-09-17 MED ORDER — ASPIRIN 81 MG PO CHEW: 324.0000 mg | CHEWABLE_TABLET | Freq: Every day | ORAL | Status: DC

## 2016-09-17 MED ORDER — MIDAZOLAM HCL 10 MG/2ML IJ SOLN
INTRAMUSCULAR | Status: AC
  Filled 2016-09-17: qty 2

## 2016-09-17 MED ORDER — MIDAZOLAM HCL 5 MG/5ML IJ SOLN
INTRAMUSCULAR | Status: DC | PRN
  Administered 2016-09-17 (×2): 3 mg via INTRAVENOUS
  Administered 2016-09-17 (×2): 2 mg via INTRAVENOUS

## 2016-09-17 MED ORDER — ALBUMIN HUMAN 5 % IV SOLN
250.0000 mL | INTRAVENOUS | Status: AC | PRN
  Administered 2016-09-17 (×2): 250 mL via INTRAVENOUS

## 2016-09-17 MED ORDER — PANTOPRAZOLE SODIUM 40 MG PO TBEC
40.0000 mg | DELAYED_RELEASE_TABLET | Freq: Every day | ORAL | Status: DC
  Administered 2016-09-19 – 2016-09-24 (×6): 40 mg via ORAL
  Filled 2016-09-17 (×6): qty 1

## 2016-09-17 MED ORDER — HEPARIN SODIUM (PORCINE) 1000 UNIT/ML IJ SOLN
INTRAMUSCULAR | Status: AC
  Filled 2016-09-17: qty 2

## 2016-09-17 MED ORDER — HEPARIN SODIUM (PORCINE) 1000 UNIT/ML IJ SOLN
INTRAMUSCULAR | Status: AC
  Filled 2016-09-17: qty 3

## 2016-09-17 MED ORDER — FENTANYL CITRATE (PF) 250 MCG/5ML IJ SOLN
INTRAMUSCULAR | Status: AC
  Filled 2016-09-17: qty 5

## 2016-09-17 MED ORDER — DEXMEDETOMIDINE HCL IN NACL 200 MCG/50ML IV SOLN
INTRAVENOUS | Status: AC
  Filled 2016-09-17: qty 100

## 2016-09-17 MED ORDER — SODIUM CHLORIDE 0.9 % IV SOLN: INTRAVENOUS | Status: DC

## 2016-09-17 MED ORDER — CHLORHEXIDINE GLUCONATE 0.12 % MT SOLN
15.0000 mL | OROMUCOSAL | Status: AC
  Administered 2016-09-17: 15 mL via OROMUCOSAL

## 2016-09-17 MED ORDER — FENTANYL CITRATE (PF) 250 MCG/5ML IJ SOLN
INTRAMUSCULAR | Status: DC | PRN
  Administered 2016-09-17: 150 ug via INTRAVENOUS
  Administered 2016-09-17: 50 ug via INTRAVENOUS
  Administered 2016-09-17: 100 ug via INTRAVENOUS
  Administered 2016-09-17: 750 ug via INTRAVENOUS
  Administered 2016-09-17: 100 ug via INTRAVENOUS
  Administered 2016-09-17: 200 ug via INTRAVENOUS

## 2016-09-17 MED ORDER — AMIODARONE HCL IN DEXTROSE 360-4.14 MG/200ML-% IV SOLN
60.0000 mg/h | INTRAVENOUS | Status: DC
Start: 2016-09-17 — End: 2016-09-17
  Administered 2016-09-17: 60 mg/h via INTRAVENOUS
  Filled 2016-09-17: qty 200

## 2016-09-17 MED ORDER — SODIUM CHLORIDE 0.45 % IV SOLN: INTRAVENOUS | Status: DC | PRN

## 2016-09-17 MED ORDER — ONDANSETRON HCL 4 MG/2ML IJ SOLN
4.0000 mg | Freq: Four times a day (QID) | INTRAMUSCULAR | Status: DC | PRN
  Administered 2016-09-18 – 2016-09-19 (×4): 4 mg via INTRAVENOUS
  Filled 2016-09-17 (×4): qty 2

## 2016-09-17 MED ORDER — AMIODARONE HCL IN DEXTROSE 360-4.14 MG/200ML-% IV SOLN
INTRAVENOUS | Status: AC
  Administered 2016-09-17: 17:00:00
  Filled 2016-09-17: qty 200

## 2016-09-17 MED ORDER — AMIODARONE LOAD VIA INFUSION
150.0000 mg | Freq: Once | INTRAVENOUS | Status: AC
  Administered 2016-09-17: 150 mg via INTRAVENOUS

## 2016-09-17 MED ORDER — CHLORHEXIDINE GLUCONATE 0.12 % MT SOLN
15.0000 mL | Freq: Once | OROMUCOSAL | Status: AC
  Administered 2016-09-17: 15 mL via OROMUCOSAL

## 2016-09-17 MED ORDER — BISACODYL 5 MG PO TBEC
10.0000 mg | DELAYED_RELEASE_TABLET | Freq: Every day | ORAL | Status: DC
  Administered 2016-09-18 – 2016-09-22 (×5): 10 mg via ORAL
  Filled 2016-09-17 (×4): qty 2

## 2016-09-17 MED ORDER — NITROGLYCERIN IN D5W 200-5 MCG/ML-% IV SOLN: 0.0000 ug/min | INTRAVENOUS | Status: DC

## 2016-09-17 MED ORDER — ESMOLOL HCL 100 MG/10ML IV SOLN
INTRAVENOUS | Status: DC | PRN
  Administered 2016-09-17: 20 mg via INTRAVENOUS
  Administered 2016-09-17: 40 mg via INTRAVENOUS
  Administered 2016-09-17 (×2): 20 mg via INTRAVENOUS

## 2016-09-17 MED ORDER — 0.9 % SODIUM CHLORIDE (POUR BTL) OPTIME
TOPICAL | Status: DC | PRN
  Administered 2016-09-17: 6000 mL

## 2016-09-17 MED ORDER — MORPHINE SULFATE (PF) 4 MG/ML IV SOLN
1.0000 mg | INTRAVENOUS | Status: AC | PRN
  Administered 2016-09-18: 2 mg via INTRAVENOUS
  Administered 2016-09-18: 4 mg via INTRAVENOUS
  Filled 2016-09-17: qty 1

## 2016-09-17 MED ORDER — CHLORHEXIDINE GLUCONATE 4 % EX LIQD: 30.0000 mL | CUTANEOUS | Status: DC

## 2016-09-17 MED ORDER — ESMOLOL HCL 100 MG/10ML IV SOLN
INTRAVENOUS | Status: AC
  Filled 2016-09-17: qty 10

## 2016-09-17 MED ORDER — LIDOCAINE 2% (20 MG/ML) 5 ML SYRINGE
INTRAMUSCULAR | Status: AC
Start: 2016-09-17 — End: ?
  Filled 2016-09-17: qty 5

## 2016-09-17 MED ORDER — MORPHINE SULFATE (PF) 4 MG/ML IV SOLN
2.0000 mg | INTRAVENOUS | Status: DC | PRN
  Administered 2016-09-17: 2 mg via INTRAVENOUS
  Administered 2016-09-18: 4 mg via INTRAVENOUS
  Administered 2016-09-18: 2 mg via INTRAVENOUS
  Administered 2016-09-19: 4 mg via INTRAVENOUS
  Filled 2016-09-17 (×6): qty 1

## 2016-09-17 MED ORDER — PHENYLEPHRINE 40 MCG/ML (10ML) SYRINGE FOR IV PUSH (FOR BLOOD PRESSURE SUPPORT)
PREFILLED_SYRINGE | INTRAVENOUS | Status: AC
  Filled 2016-09-17: qty 10

## 2016-09-17 MED ORDER — DEXTROSE 5 % IV SOLN
1.5000 g | Freq: Two times a day (BID) | INTRAVENOUS | Status: AC
  Administered 2016-09-17 – 2016-09-19 (×4): 1.5 g via INTRAVENOUS
  Filled 2016-09-17 (×4): qty 1.5

## 2016-09-17 MED ORDER — PHENYLEPHRINE 40 MCG/ML (10ML) SYRINGE FOR IV PUSH (FOR BLOOD PRESSURE SUPPORT)
PREFILLED_SYRINGE | INTRAVENOUS | Status: AC
Start: 2016-09-17 — End: ?
  Filled 2016-09-17: qty 10

## 2016-09-17 MED ORDER — METOPROLOL TARTRATE 5 MG/5ML IV SOLN: 2.5000 mg | INTRAVENOUS | Status: DC | PRN

## 2016-09-17 MED ORDER — PROTAMINE SULFATE 10 MG/ML IV SOLN
INTRAVENOUS | Status: AC
  Filled 2016-09-17: qty 15

## 2016-09-17 MED ORDER — INSULIN REGULAR BOLUS VIA INFUSION
0.0000 [IU] | Freq: Three times a day (TID) | INTRAVENOUS | Status: DC
  Filled 2016-09-17: qty 10

## 2016-09-17 MED ORDER — HEPARIN SODIUM (PORCINE) 1000 UNIT/ML IJ SOLN
INTRAMUSCULAR | Status: DC | PRN
  Administered 2016-09-17: 34000 [IU] via INTRAVENOUS
  Administered 2016-09-17: 2000 [IU] via INTRAVENOUS

## 2016-09-17 MED ORDER — ROCURONIUM BROMIDE 10 MG/ML (PF) SYRINGE
PREFILLED_SYRINGE | INTRAVENOUS | Status: DC | PRN
  Administered 2016-09-17: 50 mg via INTRAVENOUS
  Administered 2016-09-17: 100 mg via INTRAVENOUS
  Administered 2016-09-17 (×3): 50 mg via INTRAVENOUS

## 2016-09-17 MED ORDER — PROPOFOL 10 MG/ML IV BOLUS
INTRAVENOUS | Status: AC
  Filled 2016-09-17: qty 20

## 2016-09-17 MED ORDER — ACETAMINOPHEN 500 MG PO TABS
1000.0000 mg | ORAL_TABLET | Freq: Four times a day (QID) | ORAL | Status: AC
  Administered 2016-09-18 – 2016-09-22 (×16): 1000 mg via ORAL
  Filled 2016-09-17 (×17): qty 2

## 2016-09-17 MED ORDER — SODIUM CHLORIDE 0.9 % IV SOLN
0.0000 ug/kg/h | INTRAVENOUS | Status: DC
  Filled 2016-09-17: qty 2

## 2016-09-17 MED ORDER — SODIUM CHLORIDE 0.9 % IV SOLN: 250.0000 mL | INTRAVENOUS | Status: DC

## 2016-09-17 MED ORDER — HEMOSTATIC AGENTS (NO CHARGE) OPTIME
TOPICAL | Status: DC | PRN
  Administered 2016-09-17: 1 via TOPICAL

## 2016-09-17 MED ORDER — DOPAMINE-DEXTROSE 3.2-5 MG/ML-% IV SOLN
0.0000 ug/kg/min | INTRAVENOUS | Status: DC
  Administered 2016-09-19: 2 ug/kg/min via INTRAVENOUS
  Filled 2016-09-17: qty 250

## 2016-09-17 MED ORDER — CHLORHEXIDINE GLUCONATE 0.12 % MT SOLN
OROMUCOSAL | Status: AC
  Administered 2016-09-17: 15 mL via OROMUCOSAL
  Filled 2016-09-17: qty 15

## 2016-09-17 MED ORDER — SODIUM CHLORIDE 0.9 % IV SOLN
INTRAVENOUS | Status: DC | PRN
  Administered 2016-09-17 (×2): via INTRAVENOUS

## 2016-09-17 MED ORDER — METOPROLOL TARTRATE 12.5 MG HALF TABLET: 12.5000 mg | ORAL_TABLET | Freq: Two times a day (BID) | ORAL | Status: DC

## 2016-09-17 MED ORDER — FAMOTIDINE IN NACL 20-0.9 MG/50ML-% IV SOLN
20.0000 mg | Freq: Two times a day (BID) | INTRAVENOUS | Status: AC
  Administered 2016-09-17 – 2016-09-18 (×2): 20 mg via INTRAVENOUS
  Filled 2016-09-17: qty 50

## 2016-09-17 MED ORDER — DOCUSATE SODIUM 100 MG PO CAPS
200.0000 mg | ORAL_CAPSULE | Freq: Every day | ORAL | Status: DC
  Administered 2016-09-18 – 2016-09-22 (×5): 200 mg via ORAL
  Filled 2016-09-17 (×7): qty 2

## 2016-09-17 MED ORDER — EPHEDRINE 5 MG/ML INJ
INTRAVENOUS | Status: AC
  Filled 2016-09-17: qty 10

## 2016-09-17 MED ORDER — LACTATED RINGERS IV SOLN
INTRAVENOUS | Status: DC | PRN
  Administered 2016-09-17: 08:00:00 via INTRAVENOUS

## 2016-09-17 MED ORDER — TRAMADOL HCL 50 MG PO TABS
50.0000 mg | ORAL_TABLET | ORAL | Status: DC | PRN
  Administered 2016-09-18 – 2016-09-19 (×2): 100 mg via ORAL
  Administered 2016-09-23: 50 mg via ORAL
  Filled 2016-09-17: qty 2
  Filled 2016-09-17: qty 1
  Filled 2016-09-17: qty 2

## 2016-09-17 MED ORDER — PROPOFOL 10 MG/ML IV BOLUS
INTRAVENOUS | Status: DC | PRN
  Administered 2016-09-17: 80 mg via INTRAVENOUS

## 2016-09-17 MED ORDER — PHENYLEPHRINE HCL 10 MG/ML IJ SOLN
0.0000 ug/min | INTRAMUSCULAR | Status: DC
  Administered 2016-09-17 (×2): 80 ug/min via INTRAVENOUS
  Administered 2016-09-18 (×2): 100 ug/min via INTRAVENOUS
  Filled 2016-09-17 (×4): qty 2

## 2016-09-17 MED ORDER — MORPHINE SULFATE (PF) 2 MG/ML IV SOLN: 2.0000 mg | INTRAVENOUS | Status: DC | PRN

## 2016-09-17 MED ORDER — ASPIRIN EC 325 MG PO TBEC
325.0000 mg | DELAYED_RELEASE_TABLET | Freq: Every day | ORAL | Status: DC
  Administered 2016-09-18: 325 mg via ORAL
  Filled 2016-09-17: qty 1

## 2016-09-17 MED ORDER — MAGNESIUM SULFATE 4 GM/100ML IV SOLN
4.0000 g | Freq: Once | INTRAVENOUS | Status: AC
  Administered 2016-09-17: 4 g via INTRAVENOUS
  Filled 2016-09-17: qty 100

## 2016-09-17 MED ORDER — SODIUM CHLORIDE 0.9% FLUSH
3.0000 mL | Freq: Two times a day (BID) | INTRAVENOUS | Status: DC
  Administered 2016-09-18 – 2016-09-22 (×9): 3 mL via INTRAVENOUS

## 2016-09-17 MED ORDER — BISACODYL 10 MG RE SUPP: 10.0000 mg | Freq: Every day | RECTAL | Status: DC

## 2016-09-17 MED ORDER — ACETAMINOPHEN 160 MG/5ML PO SOLN: 1000.0000 mg | Freq: Four times a day (QID) | ORAL | Status: AC

## 2016-09-17 MED ORDER — ACETAMINOPHEN 650 MG RE SUPP
650.0000 mg | Freq: Once | RECTAL | Status: AC
  Administered 2016-09-17: 650 mg via RECTAL

## 2016-09-17 MED ORDER — LACTATED RINGERS IV SOLN: 500.0000 mL | Freq: Once | INTRAVENOUS | Status: DC | PRN

## 2016-09-17 MED ORDER — PHENYLEPHRINE 40 MCG/ML (10ML) SYRINGE FOR IV PUSH (FOR BLOOD PRESSURE SUPPORT)
PREFILLED_SYRINGE | INTRAVENOUS | Status: AC
  Filled 2016-09-17: qty 20

## 2016-09-17 MED ORDER — ALBUMIN HUMAN 5 % IV SOLN
INTRAVENOUS | Status: DC | PRN
  Administered 2016-09-17: 15:00:00 via INTRAVENOUS

## 2016-09-17 MED ORDER — FENTANYL CITRATE (PF) 250 MCG/5ML IJ SOLN
INTRAMUSCULAR | Status: AC
  Filled 2016-09-17: qty 20

## 2016-09-17 MED ORDER — OXYCODONE HCL 5 MG PO TABS
5.0000 mg | ORAL_TABLET | ORAL | Status: DC | PRN
  Administered 2016-09-18 – 2016-09-24 (×7): 10 mg via ORAL
  Filled 2016-09-17 (×7): qty 2

## 2016-09-17 MED ORDER — MORPHINE SULFATE (PF) 2 MG/ML IV SOLN: 1.0000 mg | INTRAVENOUS | Status: DC | PRN

## 2016-09-17 MED ORDER — ARTIFICIAL TEARS OPHTHALMIC OINT
TOPICAL_OINTMENT | OPHTHALMIC | Status: DC | PRN
  Administered 2016-09-17: 1 via OPHTHALMIC

## 2016-09-17 MED ORDER — ACETAMINOPHEN 160 MG/5ML PO SOLN: 650.0000 mg | Freq: Once | ORAL | Status: AC

## 2016-09-17 MED ORDER — POTASSIUM CHLORIDE 10 MEQ/50ML IV SOLN: 10.0000 meq | INTRAVENOUS | Status: AC

## 2016-09-17 MED ORDER — ONDANSETRON HCL 4 MG/2ML IJ SOLN
INTRAMUSCULAR | Status: AC
  Filled 2016-09-17: qty 2

## 2016-09-17 MED ORDER — DEXAMETHASONE SODIUM PHOSPHATE 10 MG/ML IJ SOLN
INTRAMUSCULAR | Status: AC
Start: 2016-09-17 — End: ?
  Filled 2016-09-17: qty 1

## 2016-09-17 MED ORDER — PROTAMINE SULFATE 10 MG/ML IV SOLN
INTRAVENOUS | Status: AC
  Filled 2016-09-17: qty 25

## 2016-09-17 MED FILL — Potassium Chloride Inj 2 mEq/ML: INTRAVENOUS | Qty: 40 | Status: AC

## 2016-09-17 MED FILL — Magnesium Sulfate Inj 50%: INTRAMUSCULAR | Qty: 10 | Status: AC

## 2016-09-17 MED FILL — Heparin Sodium (Porcine) Inj 1000 Unit/ML: INTRAMUSCULAR | Qty: 30 | Status: AC

## 2016-09-17 SURGICAL SUPPLY — 141 items
ADAPTER CARDIO PERF ANTE/RETRO (ADAPTER) ×5 IMPLANT
APPLIER CLIP 9.375 SM OPEN (CLIP)
BAG DECANTER FOR FLEXI CONT (MISCELLANEOUS) ×5 IMPLANT
BANDAGE ACE 4X5 VEL STRL LF (GAUZE/BANDAGES/DRESSINGS) ×5 IMPLANT
BANDAGE ACE 6X5 VEL STRL LF (GAUZE/BANDAGES/DRESSINGS) ×5 IMPLANT
BASKET HEART  (ORDER IN 25'S) (MISCELLANEOUS) ×1
BASKET HEART (ORDER IN 25'S) (MISCELLANEOUS) ×1
BASKET HEART (ORDER IN 25S) (MISCELLANEOUS) ×3 IMPLANT
BLADE STERNUM SYSTEM 6 (BLADE) ×5 IMPLANT
BLADE SURG 15 STRL LF DISP TIS (BLADE) ×6 IMPLANT
BLADE SURG 15 STRL SS (BLADE) ×4
BNDG GAUZE ELAST 4 BULKY (GAUZE/BANDAGES/DRESSINGS) ×5 IMPLANT
CANISTER SUCT 3000ML PPV (MISCELLANEOUS) ×5 IMPLANT
CANN PRFSN 3/8XRT ANG TPR 14 (MISCELLANEOUS) ×3
CANNULA ARTERIAL NVNT 3/8 22FR (MISCELLANEOUS) IMPLANT
CANNULA EZ GLIDE AORTIC 21FR (CANNULA) ×5 IMPLANT
CANNULA GUNDRY RCSP 15FR (MISCELLANEOUS) ×5 IMPLANT
CANNULA PRFSN 3/8XRT ANG TPR14 (MISCELLANEOUS) ×3 IMPLANT
CANNULA SUMP PERICARDIAL (CANNULA) ×5 IMPLANT
CANNULA VEN MTL TIP RT (MISCELLANEOUS) ×2
CANNULA VRC MALB SNGL STG 36FR (MISCELLANEOUS) ×3 IMPLANT
CARDIOBLATE CARDIAC ABLATION (MISCELLANEOUS)
CATH CPB KIT HENDRICKSON (MISCELLANEOUS) ×5 IMPLANT
CATH ROBINSON RED A/P 18FR (CATHETERS) ×15 IMPLANT
CATH THORACIC 36FR (CATHETERS) ×5 IMPLANT
CATH THORACIC 36FR RT ANG (CATHETERS) ×5 IMPLANT
CLAMP ISOLATOR SYNERGY LG (MISCELLANEOUS) ×5 IMPLANT
CLIP APPLIE 9.375 SM OPEN (CLIP) IMPLANT
CLIP FOGARTY SPRING 6M (CLIP) IMPLANT
CLIP VESOCCLUDE MED 24/CT (CLIP) ×5 IMPLANT
CLIP VESOCCLUDE SM WIDE 24/CT (CLIP) ×15 IMPLANT
CONN 1/2X1/2X1/2  BEN (MISCELLANEOUS) ×2
CONN 1/2X1/2X1/2 BEN (MISCELLANEOUS) ×3 IMPLANT
CONN 3/8X1/2 ST GISH (MISCELLANEOUS) ×10 IMPLANT
CONN Y 3/8X3/8X3/8  BEN (MISCELLANEOUS)
CONN Y 3/8X3/8X3/8 BEN (MISCELLANEOUS) IMPLANT
COUNTER NEEDLE 20 DBL MAG RED (NEEDLE) ×5 IMPLANT
COVER MAYO STAND STRL (DRAPES) ×5 IMPLANT
CRADLE DONUT ADULT HEAD (MISCELLANEOUS) IMPLANT
DEVICE CARDIOBLATE CARDIAC ABL (MISCELLANEOUS) IMPLANT
DRAPE CARDIOVASCULAR INCISE (DRAPES) ×2
DRAPE HALF SHEET 40X57 (DRAPES) ×5 IMPLANT
DRAPE SLUSH/WARMER DISC (DRAPES) ×5 IMPLANT
DRAPE SRG 135X102X78XABS (DRAPES) ×3 IMPLANT
DRSG COVADERM 4X14 (GAUZE/BANDAGES/DRESSINGS) ×5 IMPLANT
ELECT CAUTERY BLADE 6.4 (BLADE) IMPLANT
ELECT REM PT RETURN 9FT ADLT (ELECTROSURGICAL) ×10
ELECTRODE REM PT RTRN 9FT ADLT (ELECTROSURGICAL) ×6 IMPLANT
FELT TEFLON 1X6 (MISCELLANEOUS) ×10 IMPLANT
GAUZE SPONGE 4X4 12PLY STRL (GAUZE/BANDAGES/DRESSINGS) ×10 IMPLANT
GAUZE SPONGE 4X4 12PLY STRL LF (GAUZE/BANDAGES/DRESSINGS) ×10 IMPLANT
GEL ULTRASOUND 20GR AQUASONIC (MISCELLANEOUS) IMPLANT
GLOVE SURG SIGNA 7.5 PF LTX (GLOVE) ×15 IMPLANT
GOWN STRL REUS W/ TWL LRG LVL3 (GOWN DISPOSABLE) ×18 IMPLANT
GOWN STRL REUS W/ TWL XL LVL3 (GOWN DISPOSABLE) ×6 IMPLANT
GOWN STRL REUS W/TWL LRG LVL3 (GOWN DISPOSABLE) ×12
GOWN STRL REUS W/TWL XL LVL3 (GOWN DISPOSABLE) ×4
HARMONIC SHEARS 14CM COAG (MISCELLANEOUS) IMPLANT
HEMOSTAT POWDER SURGIFOAM 1G (HEMOSTASIS) ×15 IMPLANT
HEMOSTAT SURGICEL 2X14 (HEMOSTASIS) ×5 IMPLANT
INSERT FOGARTY XLG (MISCELLANEOUS) IMPLANT
KIT BASIN OR (CUSTOM PROCEDURE TRAY) ×5 IMPLANT
KIT ROOM TURNOVER OR (KITS) ×5 IMPLANT
KIT SUCTION CATH 14FR (SUCTIONS) ×10 IMPLANT
KIT VASOVIEW HEMOPRO VH 3000 (KITS) ×5 IMPLANT
LEAD PACING MYOCARDI (MISCELLANEOUS) ×5 IMPLANT
LINE VENT (MISCELLANEOUS) ×5 IMPLANT
LOOP VESSEL SUPERMAXI WHITE (MISCELLANEOUS) ×10 IMPLANT
MARKER GRAFT CORONARY BYPASS (MISCELLANEOUS) ×15 IMPLANT
NS IRRIG 1000ML POUR BTL (IV SOLUTION) ×30 IMPLANT
PACK OPEN HEART (CUSTOM PROCEDURE TRAY) ×5 IMPLANT
PAD ARMBOARD 7.5X6 YLW CONV (MISCELLANEOUS) ×20 IMPLANT
PAD ELECT DEFIB RADIOL ZOLL (MISCELLANEOUS) ×5 IMPLANT
PENCIL BUTTON HOLSTER BLD 10FT (ELECTRODE) ×5 IMPLANT
PROBE CRYO2-ABLATION MALLABLE (MISCELLANEOUS) ×5 IMPLANT
PUNCH AORTIC ROTATE  4.5MM 8IN (MISCELLANEOUS) ×5 IMPLANT
PUNCH AORTIC ROTATE 4.0MM (MISCELLANEOUS) IMPLANT
PUNCH AORTIC ROTATE 4.5MM 8IN (MISCELLANEOUS) IMPLANT
PUNCH AORTIC ROTATE 5MM 8IN (MISCELLANEOUS) IMPLANT
RING ANLPLS CARP-EDW PHY II 30 (Prosthesis & Implant Heart) ×3 IMPLANT
RING ANNULOPLASTY PHY II (Prosthesis & Implant Heart) ×2 IMPLANT
SET CARDIOPLEGIA MPS 5001102 (MISCELLANEOUS) ×5 IMPLANT
SPONGE LAP 18X18 X RAY DECT (DISPOSABLE) ×5 IMPLANT
SUCKER WEIGHTED FLEX (MISCELLANEOUS) ×5 IMPLANT
SUT BONE WAX W31G (SUTURE) ×5 IMPLANT
SUT ETHIBOND 2 0 SH (SUTURE) ×13 IMPLANT
SUT ETHIBOND 2 0 SH 36X2 (SUTURE) ×12 IMPLANT
SUT ETHIBOND 2 0 V4 (SUTURE) ×10 IMPLANT
SUT ETHIBOND 2 0V4 GREEN (SUTURE) ×10 IMPLANT
SUT ETHIBOND NAB MH 2-0 36IN (SUTURE) ×5 IMPLANT
SUT MNCRL AB 4-0 PS2 18 (SUTURE) IMPLANT
SUT PROLENE 3 0 SH DA (SUTURE) ×5 IMPLANT
SUT PROLENE 3 0 SH1 36 (SUTURE) ×5 IMPLANT
SUT PROLENE 4 0 RB 1 (SUTURE) ×18
SUT PROLENE 4 0 SH DA (SUTURE) ×25 IMPLANT
SUT PROLENE 4-0 RB1 .5 CRCL 36 (SUTURE) ×27 IMPLANT
SUT PROLENE 5 0 C 1 36 (SUTURE) ×10 IMPLANT
SUT PROLENE 5 0 CC1 (SUTURE) ×5 IMPLANT
SUT PROLENE 6 0 C 1 30 (SUTURE) ×15 IMPLANT
SUT PROLENE 7 0 BV1 MDA (SUTURE) ×10 IMPLANT
SUT PROLENE 8 0 BV175 6 (SUTURE) ×10 IMPLANT
SUT SILK  1 MH (SUTURE) ×6
SUT SILK 1 MH (SUTURE) ×9 IMPLANT
SUT SILK 1 TIES 10X30 (SUTURE) ×10 IMPLANT
SUT SILK 2 0 (SUTURE) ×2
SUT SILK 2 0 SH CR/8 (SUTURE) ×10 IMPLANT
SUT SILK 2 0 TIES 10X30 (SUTURE) ×5 IMPLANT
SUT SILK 2 0 TIES 17X18 (SUTURE) ×2
SUT SILK 2-0 18XBRD TIE 12 (SUTURE) ×3 IMPLANT
SUT SILK 2-0 18XBRD TIE BLK (SUTURE) ×3 IMPLANT
SUT SILK 3 0 SH CR/8 (SUTURE) ×5 IMPLANT
SUT SILK 4 0 (SUTURE) ×2
SUT SILK 4 0 TIE 10X30 (SUTURE) ×10 IMPLANT
SUT SILK 4-0 18XBRD TIE 12 (SUTURE) ×3 IMPLANT
SUT STEEL 6MS V (SUTURE) ×5 IMPLANT
SUT STEEL STERNAL CCS#1 18IN (SUTURE) IMPLANT
SUT STEEL SZ 6 DBL 3X14 BALL (SUTURE) ×5 IMPLANT
SUT TEM PAC WIRE 2 0 SH (SUTURE) ×20 IMPLANT
SUT VIC AB 1 CTX 36 (SUTURE) ×4
SUT VIC AB 1 CTX36XBRD ANBCTR (SUTURE) ×6 IMPLANT
SUT VIC AB 2-0 CT1 27 (SUTURE) ×2
SUT VIC AB 2-0 CT1 TAPERPNT 27 (SUTURE) ×3 IMPLANT
SUT VIC AB 2-0 CTX 27 (SUTURE) ×10 IMPLANT
SUT VIC AB 3-0 SH 27 (SUTURE)
SUT VIC AB 3-0 SH 27X BRD (SUTURE) IMPLANT
SUT VIC AB 3-0 X1 27 (SUTURE) ×5 IMPLANT
SUT VICRYL 3 0 (SUTURE) ×10 IMPLANT
SUT VICRYL 4-0 PS2 18IN ABS (SUTURE) IMPLANT
SUTURE E-PAK OPEN HEART (SUTURE) ×5 IMPLANT
SYR 50ML SLIP (SYRINGE) IMPLANT
SYSTEM SAHARA CHEST DRAIN ATS (WOUND CARE) ×5 IMPLANT
TOWEL GREEN STERILE (TOWEL DISPOSABLE) ×10 IMPLANT
TOWEL GREEN STERILE FF (TOWEL DISPOSABLE) ×5 IMPLANT
TOWEL OR 17X24 6PK STRL BLUE (TOWEL DISPOSABLE) IMPLANT
TOWEL OR 17X26 10 PK STRL BLUE (TOWEL DISPOSABLE) IMPLANT
TRAY FOLEY SILVER 16FR TEMP (SET/KITS/TRAYS/PACK) ×5 IMPLANT
TUBE FEEDING 8FR 16IN STR KANG (MISCELLANEOUS) ×5 IMPLANT
TUBING INSUFFLATION (TUBING) ×5 IMPLANT
UNDERPAD 30X30 (UNDERPADS AND DIAPERS) ×5 IMPLANT
VRC MALLEABLE SINGLE STG 36FR (MISCELLANEOUS) ×5
WATER STERILE IRR 1000ML POUR (IV SOLUTION) ×10 IMPLANT

## 2016-09-17 NOTE — H&P (Signed)
PCP is Kristen Loader, Tor Netters., MD Referring Provider is Caryl Ada, MD      Chief Complaint  Patient presents with  . Coronary Artery Disease    Surgical eval, Cardiac Cath and ECHO 07/30/2016 @ [facility]    HPI: [name] Green is referred for evaluation of coronary artery disease.  [name] Green is a 71 year old gentleman with no prior cardiac history. He does have a history of hypertension, hyperlipidemia and tobacco abuse. He also has known peripheral arterial disease and has had a stent placed in his left leg by Dr. Sondra Come in [facility].  He was in his usual state of health until early 2018 when he started noticing shortness of breath with exertion. He got to the point that even walking to his mailbox he would get severely short of breath like he had just run a race. In May he went to the hospital in [place] with severe shortness of breath. He was found to be in atrial flutter with a rapid ventricular response. His troponin was mildly elevated at 0.1. He was given metoprolol and Lasix and apparently signed out AMA at that time. He was sent to Dr. Red Christians for further evaluation. He was started on Eliquis and his metoprolol was increased to 50 mg daily. His symptoms improved after that. He still has shortness of breath on exertion, but can walk half a mile before it occurs.  He had an echocardiogram on 07/30/2016 which showed moderate to severe mitral regurgitation with thickened mitral valve leaflets. Ejection fraction was approximately 30% with inferior akinesis and global hypokinesis. Cardiac catheterization on the same day revealed severe two-vessel coronary disease with an 80% LAD stenosis in the region of the take off 2 diagonal branches and a 70% stenosis in the second diagonal. There was subtotal occlusion of the right coronary with only a small posterior descending visible distally.  He and his family. Her different opinions on bypass surgery versus stenting in [place] and wished to come for a  second opinion.       Past Medical History:  Diagnosis Date  . A-fib (HCC)    persistent  . CAD, multiple vessel   . Cardiomyopathy (HCC)   . Hyperlipidemia   . Mitral regurgitation 07/30/2016   ECHO  . PAD (peripheral artery disease) (HCC)    FOLLOWED BY DR. Sondra Come, ON PLAVIX  . S/P cardiac cath 07/30/2016    No past surgical history on file. Hernia repair Stent in left leg artery  No family history on file. Father died of cancer Mother stroke Brother stroke  Social History      Social History  Substance Use Topics  . Smoking status: Former Smoker    Packs/day: 1.00    Years: 55.00    Types: Cigarettes  . Smokeless tobacco: Never Used  . Alcohol use Not on file          Current Outpatient Prescriptions  Medication Sig Dispense Refill  . amLODipine (NORVASC) 5 MG tablet Take 5 mg by mouth daily.    Marland Kitchen apixaban (ELIQUIS) 5 MG TABS tablet Take 5 mg by mouth 2 (two) times daily.    Marland Kitchen atorvastatin (LIPITOR) 40 MG tablet Take 40 mg by mouth daily.    . clopidogrel (PLAVIX) 75 MG tablet Take 75 mg by mouth daily.    Marland Kitchen KRILL OIL PO Take 500 mg by mouth daily.     Marland Kitchen lisinopril (PRINIVIL,ZESTRIL) 10 MG tablet Take 10 mg by mouth daily.    Marland Kitchen  metoprolol succinate (TOPROL-XL) 50 MG 24 hr tablet Take 50 mg by mouth daily. Take with or immediately following a meal.     No current facility-administered medications for this visit.     No Known Allergies  Review of Systems  Constitutional: Positive for fatigue. Negative for activity change and unexpected weight change.  HENT: Negative for dental problem, trouble swallowing and voice change.   Eyes: Negative for visual disturbance.  Respiratory: Positive for shortness of breath. Negative for chest tightness.   Cardiovascular: Negative for chest pain, palpitations and leg swelling.       Varicose veins right leg  Gastrointestinal: Negative for abdominal pain and blood in stool.   Genitourinary: Negative for difficulty urinating and dysuria.  Musculoskeletal: Negative for arthralgias and myalgias.  Neurological: Negative for dizziness, syncope and weakness.  Hematological: Bruises/bleeds easily.  All other systems reviewed and are negative.   BP 128/86 (BP Location: Right Arm, Patient Position: Sitting, Cuff Size: Large)   Pulse (!) 116   Resp 16   Ht 6\' 1"  (1.854 m)   Wt 213 lb (96.6 kg)   SpO2 98% Comment: RA  BMI 28.10 kg/m  Physical Exam  Constitutional: He is oriented to person, place, and time. He appears well-developed and well-nourished. No distress.  HENT:  Head: Normocephalic and atraumatic.  Mouth/Throat: No oropharyngeal exudate.  Eyes: Conjunctivae and EOM are normal. No scleral icterus.  Neck: Neck supple. No thyromegaly present.  Cardiovascular: Exam reveals no friction rub.   Murmur (Faint systolic) heard. Irregularly irregular  Pulmonary/Chest: Effort normal and breath sounds normal. No respiratory distress. He has no wheezes. He has no rales.  Abdominal: Soft. He exhibits no distension. There is no tenderness.  Musculoskeletal: He exhibits no edema.  Varicosities right thigh  Lymphadenopathy:    He has no cervical adenopathy.  Neurological: He is alert and oriented to person, place, and time. No cranial nerve deficit. He exhibits normal muscle tone.  Motor 5 out of 5 all extremities  Skin: Skin is warm and dry.  Vitals reviewed.    Diagnostic Tests: I personally reviewed the cardiac catheterization and echocardiogram images and concur with the official reports from Dr. Red Christianshui as noted below  Cardiac catheterization 1. Severe RCA/LAD lesions, LAD bifurcating with moderate size diagonal which also has a lesion and it. 2. Moderate global LV dysfunction  LAD 80% stenosis in mid LAD 70% stenosis second diagonal RCA subtotal occlusion  Echocardiogram Left ventricular cavity size is normal. Moderate global LV hypokinesis  with inferior basal wall akinesis Ejection fraction is visually estimated at 30% Moderate to severe mitral regurgitation Normal right ventricular structure and function  Also noted moderate thickening of mitral valve leaflets  Impression: [name] Green is a 71 year old man with multiple cardiac risk factors who presented with shortness of breath on exertion. Workup revealed severe two-vessel coronary disease with ischemic cardiomyopathy, mitral regurgitation, and atrial flutter with a rapid ventricular response. His symptoms, but not completely resolved, have improved with medical therapy.  He has gone from class IV to class II symptoms.  Options include medical therapy, limited revascularization with angioplasty, or surgical treatment. Should he opt for surgery I think there is a high probability he would need the mitral valve addressed at the time of the procedure. I also would recommend doing a Maze procedure if he were to undergo surgical revascularization.  I described the proposed surgical procedure of coronary bypass grafting, possible mitral valve repair, maze procedure, possible left radial artery harvest to Mr.  Green and his family. I informed him of the general nature of the procedure, the need for general anesthesia, the use of cardioplegic bypass, the expected hospital stay, and the overall recovery. They understand this is a major operation with potential for major morbidity or mortality. I informed him of the indications, risks, benefits, and alternatives. They understand the risk include, but are not limited to death, MI, DVT, PE, bleeding, possible need for transfusion, infection, cardiac arrhythmias, heart block, respiratory or renal failure, as well as the possibility of other unforeseeable complications. They understand that Maze procedure as effective approximately 85% of the time for treatment of atrial fibrillation. He will need to remain on anticoagulation in the near term.  They  are anxious to proceed with surgical revascularization and correction of mitral regurgitation and atrial fibrillation.  Plan: See dentist for dental evaluation Stop Plavix 5 days prior to surgery and start aspirin 81 mg a day Stop Eliquis 2 days prior to surgery Coronary bypass graft 3, possible left radial artery harvest, mitral valve repair, Maze procedure on Wednesday, 09/17/2016  Loreli Slot, MD [facility] [phone]  Addendum He also was seen at [facility] within the past couple of days for an abdominal ultrasound to evaluate elevated liver function testing. There was a question of 2 small hypoechoic areas in the pancreas. He did not inform you that while he was here his visit and I found those records through care everywhere.    Electronically signed by Loreli Slot, MD at 08/14/2016 5:50 PM

## 2016-09-17 NOTE — Anesthesia Procedure Notes (Signed)
Arterial Line Insertion Start/End9/05/2016 8:00 AM, 09/17/2016 8:10 AM Performed by: Epifanio LeschesMERCER, Bellamy Judson L, CRNA  Patient location: Pre-op. Preanesthetic checklist: patient identified, IV checked, site marked, risks and benefits discussed, surgical consent, monitors and equipment checked, pre-op evaluation, timeout performed and anesthesia consent Lidocaine 1% used for infiltration and patient sedated Right, radial was placed Catheter size: 20 G Hand hygiene performed  and maximum sterile barriers used  Allen's test indicative of satisfactory collateral circulation Attempts: 1 Procedure performed without using ultrasound guided technique. Ultrasound Notes:anatomy identified Following insertion, dressing applied and Biopatch. Post procedure assessment: normal  Patient tolerated the procedure well with no immediate complications.

## 2016-09-17 NOTE — Progress Notes (Signed)
  Echocardiogram Echocardiogram Transesophageal has been performed.  Leta JunglingCooper, Jeremy Ditullio M 09/17/2016, 12:11 PM

## 2016-09-17 NOTE — Transfer of Care (Signed)
Immediate Anesthesia Transfer of Care Note  Patient: Patricia Pesahomas E Felber  Procedure(s) Performed: Procedure(s) with comments: CORONARY ARTERY BYPASS GRAFTING (CABG), ON PUMP, TIMES THREE, USING LEFT INTERNAL MAMMARY ARTERY AND ENDOSCOPICALLY HARVESTED LEFT GREATER SAPHENOUS VEIN (N/A) - LIMA-LAD SVG-PD SVG-DIAG MITRAL VALVE REPAIR (MVR) (N/A) - Using Size 30 Physio II Annuloplasty Ring MAZE (N/A) TRANSESOPHAGEAL ECHOCARDIOGRAM (TEE) (N/A)  Patient Location: SICU  Anesthesia Type:General  Level of Consciousness: Patient remains intubated per anesthesia plan  Airway & Oxygen Therapy: Patient remains intubated per anesthesia plan and Patient placed on Ventilator (see vital sign flow sheet for setting)  Post-op Assessment: Report given to RN and Post -op Vital signs reviewed and stable  Post vital signs: Reviewed and stable  Last Vitals:  Vitals:   09/17/16 0650 09/17/16 1545  BP: 138/88   Pulse: (!) 110   Resp: 20   Temp: 36.8 C   SpO2: 98% 97%    Last Pain: There were no vitals filed for this visit.    Patients Stated Pain Goal: 2 (09/17/16 16100713)  Complications: No apparent anesthesia complications

## 2016-09-17 NOTE — Anesthesia Postprocedure Evaluation (Signed)
Anesthesia Post Note  Patient: Patricia Pesahomas E Mcweeney  Procedure(s) Performed: Procedure(s) (LRB): CORONARY ARTERY BYPASS GRAFTING (CABG), ON PUMP, TIMES THREE, USING LEFT INTERNAL MAMMARY ARTERY AND ENDOSCOPICALLY HARVESTED LEFT GREATER SAPHENOUS VEIN (N/A) MITRAL VALVE REPAIR (MVR) (N/A) MAZE (N/A) TRANSESOPHAGEAL ECHOCARDIOGRAM (TEE) (N/A)     Patient location during evaluation: SICU Anesthesia Type: General Level of consciousness: sedated Pain management: pain level controlled Vital Signs Assessment: post-procedure vital signs reviewed and stable Respiratory status: patient remains intubated per anesthesia plan Cardiovascular status: stable Anesthetic complications: no    Last Vitals:  Vitals:   09/17/16 1715 09/17/16 1730  BP: 106/77   Pulse: (!) 109   Resp: 19 14  Temp: (!) 36.1 C (!) 36 C  SpO2: 100%     Last Pain:  Vitals:   09/17/16 1600  TempSrc: Core (Comment)                 Liam Cammarata DAVID

## 2016-09-17 NOTE — OR Nursing (Signed)
Twenty minute call to SICU charge nurse at 1502. Spoke to Anacocohristine. Cath Lab also notified of timing.

## 2016-09-17 NOTE — Brief Op Note (Signed)
09/17/2016  1:42 PM  PATIENT:  [name] Green  71 y.o. 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  PROCEDURE:  Procedure(s) with comments: CORONARY ARTERY BYPASS GRAFTING (CABG), ON PUMP, TIMES THREE, USING LEFT INTERNAL MAMMARY ARTERY AND ENDOSCOPICALLY HARVESTED LEFT GREATER SAPHENOUS VEIN (N/A) MITRAL VALVE REPAIR (MVR) (N/A) - Using Size 30 Physio II Annuloplasty Ring MAZE (N/A) TRANSESOPHAGEAL ECHOCARDIOGRAM (TEE) (N/A) LIMA-LAD SVG-PD SVG-DIAG  SURGEON:  Surgeon(s) and Role:    * Loreli SlotHendrickson, Steven C, MD - Primary  PHYSICIAN ASSISTANT: WAYNE GOLD PA-C  ANESTHESIA:   general  EBL:  Total I/O In: 1000 [I.V.:1000] Out: 450 [Urine:450]  BLOOD ADMINISTERED:none  DRAINS: 3 CHEST TUBES   LOCAL MEDICATIONS USED:  NONE  SPECIMEN:  No Specimen  DISPOSITION OF SPECIMEN:  N/A  COUNTS:  YES  TOURNIQUET:  * No tourniquets in log *  DICTATION: .Other Dictation: Dictation Number PENDING  PLAN OF CARE: Admit to inpatient   PATIENT DISPOSITION:  ICU - intubated and hemodynamically stable.   Delay start of Pharmacological VTE agent (>24hrs) due to surgical blood loss or risk of bleeding: yes  COMPLICATIONS: NO KNOWN

## 2016-09-17 NOTE — Interval H&P Note (Signed)
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 After consideration of risks, benefits and other options for treatment, the patient has consented to  Procedure(s): CORONARY ARTERY BYPASS GRAFTING (CABG) (N/A) RADIAL ARTERY HARVEST (Left) MITRAL VALVE REPAIR (MVR) (N/A) MAZE (N/A) TRANSESOPHAGEAL ECHOCARDIOGRAM (TEE) (N/A) as a surgical intervention .  The patient's history has been reviewed, patient examined, no change in status, stable for surgery.  I have reviewed the patient's chart and labs.  Questions were answered to the patient's satisfaction.     Loreli SlotSteven C Hendrickson

## 2016-09-17 NOTE — OR Nursing (Signed)
Forty five minute call to SICU charge nurse at 1434. Spoke to Christine. 

## 2016-09-17 NOTE — Anesthesia Procedure Notes (Signed)
Central Venous Catheter Insertion Performed by: Shelton SilvasHOLLIS, Renae Mottley D, anesthesiologist Start/End9/05/2016 7:55 AM, 09/17/2016 8:00 AM Patient location: Pre-op. Preanesthetic checklist: patient identified, IV checked, site marked, risks and benefits discussed, surgical consent, monitors and equipment checked, pre-op evaluation, timeout performed and anesthesia consent Hand hygiene performed  and maximum sterile barriers used  PA cath was placed.Swan type:thermodilution Procedure performed using ultrasound guided technique. Ultrasound Notes:anatomy identified, needle tip was noted to be adjacent to the nerve/plexus identified, no ultrasound evidence of intravascular and/or intraneural injection and image(s) printed for medical record Attempts: 1 Patient tolerated the procedure well with no immediate complications.

## 2016-09-17 NOTE — Anesthesia Procedure Notes (Signed)
Procedure Name: Intubation Date/Time: 09/17/2016 9:00 AM Performed by: Rejeana Brock L Pre-anesthesia Checklist: Patient identified, Emergency Drugs available, Suction available and Patient being monitored Patient Re-evaluated:Patient Re-evaluated prior to induction Oxygen Delivery Method: Circle System Utilized Preoxygenation: Pre-oxygenation with 100% oxygen Induction Type: IV induction Ventilation: Mask ventilation without difficulty Laryngoscope Size: Mac and 4 Grade View: Grade I Tube type: Oral Number of attempts: 1 Airway Equipment and Method: Stylet and Oral airway Placement Confirmation: ETT inserted through vocal cords under direct vision,  positive ETCO2 and breath sounds checked- equal and bilateral Secured at: 24 cm Tube secured with: Tape Dental Injury: Teeth and Oropharynx as per pre-operative assessment

## 2016-09-17 NOTE — Anesthesia Procedure Notes (Signed)
Central Venous Catheter Insertion Performed by: Shelton SilvasHOLLIS, Alissah Redmon D, anesthesiologist Start/End9/05/2016 7:35 AM, 09/17/2016 7:45 AM Patient location: Pre-op. Preanesthetic checklist: patient identified, IV checked, site marked, risks and benefits discussed, surgical consent, monitors and equipment checked, pre-op evaluation, timeout performed and anesthesia consent Position: Trendelenburg Lidocaine 1% used for infiltration and patient sedated Hand hygiene performed , maximum sterile barriers used  and Seldinger technique used Catheter size: 8.5 Fr Total catheter length 10. Central line was placed.Sheath introducer Swan type:thermodilution PA Cath depth:50 Procedure performed using ultrasound guided technique. Ultrasound Notes:anatomy identified, needle tip was noted to be adjacent to the nerve/plexus identified, no ultrasound evidence of intravascular and/or intraneural injection and image(s) printed for medical record Attempts: 1 Following insertion, line sutured and dressing applied. Post procedure assessment: blood return through all ports, free fluid flow and no air  Patient tolerated the procedure well with no immediate complications.

## 2016-09-17 NOTE — Progress Notes (Signed)
Patient ID: Patricia Pesahomas E [place], male   DOB: Sep 06, 1945, 71 y.o.   MRN: [id number]  SICU Evening Rounds:   Hemodynamically stable, A-pacing CI = 1.9 on dop 4  Has started to wake up on vent.    Urine output ok  CT output low  CBC    Component Value Date/Time   WBC 16.1 (H) 09/17/2016 1555   RBC 4.64 09/17/2016 1555   HGB 13.3 09/17/2016 1601   HCT 39.0 09/17/2016 1601   PLT 136 (L) 09/17/2016 1555   MCV 85.6 09/17/2016 1555   MCH 26.9 09/17/2016 1555   MCHC 31.5 09/17/2016 1555   RDW 15.3 09/17/2016 1555     BMET    Component Value Date/Time   NA 141 09/17/2016 1601   K 4.4 09/17/2016 1601   CL 106 09/17/2016 1441   CO2 24 09/12/2016 1233   GLUCOSE 135 (H) 09/17/2016 1601   BUN 23 (H) 09/17/2016 1441   CREATININE 0.80 09/17/2016 1441   CALCIUM 9.2 09/12/2016 1233   GFRNONAA >60 09/12/2016 1233   GFRAA >60 09/12/2016 1233     A/P:  Stable postop course. Continue current plans. Wean vent as tolerated.

## 2016-09-17 NOTE — Anesthesia Preprocedure Evaluation (Signed)
Anesthesia Evaluation  Patient identified by MRN, date of birth, ID band Patient awake    Reviewed: Allergy & Precautions, NPO status , Patient's Chart, lab work & pertinent test results  Airway Mallampati: I  TM Distance: >3 FB Neck ROM: Full    Dental   Pulmonary former smoker,    Pulmonary exam normal        Cardiovascular hypertension, + CAD  Normal cardiovascular exam     Neuro/Psych    GI/Hepatic   Endo/Other    Renal/GU      Musculoskeletal   Abdominal   Peds  Hematology   Anesthesia Other Findings   Reproductive/Obstetrics                             Anesthesia Physical Anesthesia Plan  ASA: III  Anesthesia Plan: General   Post-op Pain Management:    Induction: Intravenous  PONV Risk Score and Plan: 2 and Treatment may vary due to age or medical condition  Airway Management Planned: Oral ETT  Additional Equipment: Arterial line, CVP, PA Cath, TEE, 3D TEE and Ultrasound Guidance Line Placement  Intra-op Plan:   Post-operative Plan: Post-operative intubation/ventilation  Informed Consent: I have reviewed the patients History and Physical, chart, labs and discussed the procedure including the risks, benefits and alternatives for the proposed anesthesia with the patient or authorized representative who has indicated his/her understanding and acceptance.     Plan Discussed with: CRNA and Surgeon  Anesthesia Plan Comments:         Anesthesia Quick Evaluation

## 2016-09-18 ENCOUNTER — Encounter (HOSPITAL_COMMUNITY): Payer: Self-pay | Admitting: Thoracic Surgery (Cardiothoracic Vascular Surgery)

## 2016-09-18 ENCOUNTER — Inpatient Hospital Stay (HOSPITAL_COMMUNITY): Payer: Medicare Other

## 2016-09-18 LAB — CBC
HCT: 33.5 % — ABNORMAL LOW (ref 39.0–52.0)
HCT: 31.1 % — ABNORMAL LOW (ref 39.0–52.0)
HEMOGLOBIN: 10.7 g/dL — AB (ref 13.0–17.0)
HEMOGLOBIN: 9.7 g/dL — AB (ref 13.0–17.0)
MCH: 27 pg (ref 26.0–34.0)
MCH: 26.6 pg (ref 26.0–34.0)
MCHC: 31.9 g/dL (ref 30.0–36.0)
MCHC: 31.2 g/dL (ref 30.0–36.0)
MCV: 84.6 fL (ref 78.0–100.0)
MCV: 85.4 fL (ref 78.0–100.0)
PLATELETS: 120 10*3/uL — AB (ref 150–400)
PLATELETS: 99 10*3/uL — AB (ref 150–400)
RBC: 3.96 MIL/uL — AB (ref 4.22–5.81)
RBC: 3.64 MIL/uL — AB (ref 4.22–5.81)
RDW: 15.7 % — ABNORMAL HIGH (ref 11.5–15.5)
RDW: 15.9 % — ABNORMAL HIGH (ref 11.5–15.5)
WBC: 13.4 10*3/uL — ABNORMAL HIGH (ref 4.0–10.5)
WBC: 12.3 10*3/uL — ABNORMAL HIGH (ref 4.0–10.5)

## 2016-09-18 LAB — POCT I-STAT, CHEM 8
BUN: 28 mg/dL — AB (ref 6–20)
CREATININE: 1.1 mg/dL (ref 0.61–1.24)
Calcium, Ion: 1.22 mmol/L (ref 1.15–1.40)
Chloride: 105 mmol/L (ref 101–111)
Glucose, Bld: 157 mg/dL — ABNORMAL HIGH (ref 65–99)
HEMATOCRIT: 30 % — AB (ref 39.0–52.0)
HEMOGLOBIN: 10.2 g/dL — AB (ref 13.0–17.0)
POTASSIUM: 4.9 mmol/L (ref 3.5–5.1)
SODIUM: 137 mmol/L (ref 135–145)
TCO2: 22 mmol/L (ref 22–32)

## 2016-09-18 LAB — GLUCOSE, CAPILLARY
GLUCOSE-CAPILLARY: 126 mg/dL — AB (ref 65–99)
GLUCOSE-CAPILLARY: 133 mg/dL — AB (ref 65–99)
GLUCOSE-CAPILLARY: 135 mg/dL — AB (ref 65–99)
GLUCOSE-CAPILLARY: 137 mg/dL — AB (ref 65–99)
GLUCOSE-CAPILLARY: 148 mg/dL — AB (ref 65–99)
GLUCOSE-CAPILLARY: 167 mg/dL — AB (ref 65–99)
Glucose-Capillary: 134 mg/dL — ABNORMAL HIGH (ref 65–99)
Glucose-Capillary: 141 mg/dL — ABNORMAL HIGH (ref 65–99)
Glucose-Capillary: 130 mg/dL — ABNORMAL HIGH (ref 65–99)
Glucose-Capillary: 129 mg/dL — ABNORMAL HIGH (ref 65–99)
Glucose-Capillary: 127 mg/dL — ABNORMAL HIGH (ref 65–99)
Glucose-Capillary: 114 mg/dL — ABNORMAL HIGH (ref 65–99)
Glucose-Capillary: 123 mg/dL — ABNORMAL HIGH (ref 65–99)
Glucose-Capillary: 84 mg/dL (ref 65–99)
Glucose-Capillary: 142 mg/dL — ABNORMAL HIGH (ref 65–99)

## 2016-09-18 LAB — CREATININE, SERUM
CREATININE: 1.13 mg/dL (ref 0.61–1.24)
Creatinine, Ser: 1.17 mg/dL (ref 0.61–1.24)
GFR calc Af Amer: >60 mL/min (ref >60)
GFR calc Af Amer: >60 mL/min (ref >60)
GFR calc non Af Amer: >60 mL/min (ref >60)

## 2016-09-18 LAB — POCT I-STAT 3, ART BLOOD GAS (G3+)
ACID-BASE DEFICIT: 4 mmol/L — AB (ref 0.0–2.0)
Bicarbonate: 21 mmol/L (ref 20.0–28.0)
O2 Saturation: 91 %
PH ART: 7.346 — AB (ref 7.350–7.450)
TCO2: 22 mmol/L (ref 22–32)
pCO2 arterial: 38.2 mmHg (ref 32.0–48.0)
pO2, Arterial: 62 mmHg — ABNORMAL LOW (ref 83.0–108.0)

## 2016-09-18 LAB — BASIC METABOLIC PANEL
ANION GAP: 5 (ref 5–15)
BUN: 21 mg/dL — AB (ref 6–20)
CHLORIDE: 112 mmol/L — AB (ref 101–111)
CO2: 21 mmol/L — ABNORMAL LOW (ref 22–32)
Calcium: 7.9 mg/dL — ABNORMAL LOW (ref 8.9–10.3)
Creatinine, Ser: 1.14 mg/dL (ref 0.61–1.24)
Glucose, Bld: 136 mg/dL — ABNORMAL HIGH (ref 65–99)
POTASSIUM: 4.5 mmol/L (ref 3.5–5.1)
SODIUM: 138 mmol/L (ref 135–145)

## 2016-09-18 LAB — MAGNESIUM
MAGNESIUM: 2.4 mg/dL (ref 1.7–2.4)
Magnesium: 2.5 mg/dL — ABNORMAL HIGH (ref 1.7–2.4)

## 2016-09-18 MED ORDER — INSULIN DETEMIR 100 UNIT/ML ~~LOC~~ SOLN
20.0000 [IU] | Freq: Once | SUBCUTANEOUS | Status: AC
  Administered 2016-09-18: 20 [IU] via SUBCUTANEOUS
  Filled 2016-09-18: qty 0.2

## 2016-09-18 MED ORDER — INSULIN DETEMIR 100 UNIT/ML ~~LOC~~ SOLN
20.0000 [IU] | Freq: Every day | SUBCUTANEOUS | Status: DC
  Administered 2016-09-19: 20 [IU] via SUBCUTANEOUS
  Filled 2016-09-18 (×2): qty 0.2

## 2016-09-18 MED ORDER — ENOXAPARIN SODIUM 40 MG/0.4ML ~~LOC~~ SOLN
40.0000 mg | Freq: Every day | SUBCUTANEOUS | Status: DC
  Administered 2016-09-18: 40 mg via SUBCUTANEOUS
  Filled 2016-09-18: qty 0.4

## 2016-09-18 MED ORDER — SODIUM CHLORIDE 0.9 % IV SOLN
0.0000 ug/min | INTRAVENOUS | Status: DC
  Administered 2016-09-18: 50 ug/min via INTRAVENOUS
  Administered 2016-09-18: 100 ug/min via INTRAVENOUS
  Administered 2016-09-18: 70 ug/min via INTRAVENOUS
  Administered 2016-09-18: 40 ug/min via INTRAVENOUS
  Filled 2016-09-18 (×2): qty 4

## 2016-09-18 MED ORDER — INSULIN ASPART 100 UNIT/ML ~~LOC~~ SOLN
0.0000 [IU] | SUBCUTANEOUS | Status: DC
  Administered 2016-09-18: 4 [IU] via SUBCUTANEOUS
  Administered 2016-09-18 – 2016-09-19 (×4): 2 [IU] via SUBCUTANEOUS

## 2016-09-18 MED FILL — Electrolyte-R (PH 7.4) Solution: INTRAVENOUS | Qty: 3000 | Status: AC

## 2016-09-18 MED FILL — Heparin Sodium (Porcine) Inj 1000 Unit/ML: INTRAMUSCULAR | Qty: 20 | Status: AC

## 2016-09-18 MED FILL — Lidocaine HCl IV Inj 20 MG/ML: INTRAVENOUS | Qty: 5 | Status: AC

## 2016-09-18 MED FILL — Sodium Bicarbonate IV Soln 8.4%: INTRAVENOUS | Qty: 50 | Status: AC

## 2016-09-18 MED FILL — Sodium Chloride IV Soln 0.9%: INTRAVENOUS | Qty: 3000 | Status: AC

## 2016-09-18 MED FILL — Mannitol IV Soln 20%: INTRAVENOUS | Qty: 500 | Status: AC

## 2016-09-18 NOTE — Progress Notes (Addendum)
[facility] DAILY ICU PROGRESS NOTE                   [address]            [place] [id number]          [phone]   1 Day Post-Op Procedure(s) (LRB): CORONARY ARTERY BYPASS GRAFTING (CABG), ON PUMP, TIMES THREE, USING LEFT INTERNAL MAMMARY ARTERY AND ENDOSCOPICALLY HARVESTED LEFT GREATER SAPHENOUS VEIN (N/A) MITRAL VALVE REPAIR (MVR) (N/A) MAZE (N/A) TRANSESOPHAGEAL ECHOCARDIOGRAM (TEE) (N/A)  Total Length of Stay:  LOS: 1 day   Subjective:  Feel pretty rough.  He chest hurts.  He is having issues coughing stuff up.  He had some nausea and 1 episode of emesis this morning.  Objective: Vital signs in last 24 hours: Temp:  [96.8 F (36 C)-98.6 F (37 C)] 97.6 F (36.4 C) (09/06 0744) Pulse Rate:  [30-116] 85 (09/06 0700) Cardiac Rhythm: Normal sinus rhythm;Atrial paced (09/05 2000) Resp:  [0-25] 13 (09/06 0700) BP: (81-129)/(48-85) 102/64 (09/06 0700) SpO2:  [93 %-100 %] 98 % (09/06 0700) Arterial Line BP: (41-107)/(30-55) 101/55 (09/06 0700) FiO2 (%):  [40 %-100 %] 40 % (09/05 2300) Weight:  [206 lb 9.1 oz (93.7 kg)-210 lb 8.6 oz (95.5 kg)] 206 lb 9.1 oz (93.7 kg) (09/06 0403)  Filed Weights   09/17/16 1600 09/18/16 0403  Weight: 210 lb 8.6 oz (95.5 kg) 206 lb 9.1 oz (93.7 kg)    Weight change:    Hemodynamic parameters for last 24 hours: PAP: (28-49)/(12-28) 49/28 CO:  [5.4 L/min-5.8 L/min] 5.4 L/min CI:  [2.5 L/min/m2-3.3 L/min/m2] 2.5 L/min/m2  Intake/Output from previous day: 09/05 0701 - 09/06 0700 In: 6935 [I.V.:5915; Blood:620; IV Piggyback:400] Out: 3820 [Urine:1520; Blood:1600; Chest Tube:700]  Current Meds: Scheduled Meds: . acetaminophen  1,000 mg Oral Q6H   Or  . acetaminophen (TYLENOL) oral liquid 160 mg/5 mL  1,000 mg Per Tube Q6H  . aspirin EC  325 mg Oral Daily   Or  . aspirin  324 mg Per Tube Daily  . bisacodyl  10 mg Oral Daily   Or  . bisacodyl  10 mg Rectal Daily  . docusate sodium  200 mg Oral Daily  . enoxaparin  (LOVENOX) injection  40 mg Subcutaneous QHS  . insulin aspart  0-24 Units Subcutaneous Q4H  . insulin detemir  20 Units Subcutaneous Once  . [START ON 09/19/2016] insulin detemir  20 Units Subcutaneous Daily  . insulin regular  0-10 Units Intravenous TID WC  . metoprolol tartrate  12.5 mg Oral BID   Or  . metoprolol tartrate  12.5 mg Per Tube BID  . [START ON 09/19/2016] pantoprazole  40 mg Oral Daily  . sodium chloride flush  3 mL Intravenous Q12H   Continuous Infusions: . sodium chloride    . sodium chloride    . sodium chloride 20 mL/hr at 09/18/16 0600  . albumin human    . amiodarone 30 mg/hr (09/18/16 0600)  . cefUROXime (ZINACEF)  IV Stopped (09/17/16 2312)  . dexmedetomidine (PRECEDEX) IV infusion Stopped (09/17/16 2231)  . DOPamine 2.01 mcg/kg/min (09/18/16 0600)  . famotidine (PEPCID) IV Stopped (09/17/16 1707)  . insulin (NOVOLIN-R) infusion 2.1 Units/hr (09/18/16 0600)  . lactated ringers 500 mL (09/17/16 1700)  . lactated ringers 20 mL/hr at 09/18/16 0600  . lactated ringers 20 mL/hr at 09/18/16 0600  . nitroGLYCERIN Stopped (09/17/16 1545)  . phenylephrine (NEO-SYNEPHRINE) Adult infusion 100 mcg/min (09/18/16 0600)   PRN  Meds:.sodium chloride, albumin human, lactated ringers, metoprolol tartrate, midazolam, morphine injection, ondansetron (ZOFRAN) IV, oxyCODONE, sodium chloride flush, traMADol  General appearance: alert, cooperative and no distress Heart: regular rate and rhythm and paced Lungs: diminished breath sounds bibasilar Abdomen: soft, non-tender; bowel sounds normal; no masses,  no organomegaly Extremities: edema trace Wound: clean and dry, mild ecchymosis of LLE  Lab Results: CBC: Recent Labs  09/17/16 2220 09/17/16 2233 09/18/16 0401  WBC 14.8*  --  13.4*  HGB 11.0* 11.2* 10.7*  HCT 35.3* 33.0* 33.5*  PLT 123*  --  120*   BMET:  Recent Labs  09/17/16 2233 09/18/16 0401  NA 140 138  K 4.0 4.5  CL 110 112*  CO2  --  21*  GLUCOSE 147* 136*    BUN 21* 21*  CREATININE 1.00 1.14  CALCIUM  --  7.9*    CMET: Lab Results  Component Value Date   WBC 13.4 (H) 09/18/2016   HGB 10.7 (L) 09/18/2016   HCT 33.5 (L) 09/18/2016   PLT 120 (L) 09/18/2016   GLUCOSE 136 (H) 09/18/2016   ALT 30 09/12/2016   AST 31 09/12/2016   NA 138 09/18/2016   K 4.5 09/18/2016   CL 112 (H) 09/18/2016   CREATININE 1.14 09/18/2016   BUN 21 (H) 09/18/2016   CO2 21 (L) 09/18/2016   INR 1.34 09/17/2016   HGBA1C 6.1 (H) 09/12/2016      PT/INR:  Recent Labs  09/17/16 1555  LABPROT 16.5*  INR 1.34   Radiology: Dg Chest Port 1 View  Result Date: 09/17/2016 CLINICAL DATA:  Coronary artery disease. Mitral valve disease. Status post CABG. Status post mitral valve repair. EXAM: PORTABLE CHEST 1 VIEW COMPARISON:  09/12/2016 FINDINGS: Endotracheal tube, chest tubes, Swan-Ganz catheter and NG tube all appear in good position. Tiny right apical pneumothorax. Slight bilateral interstitial pulmonary edema. Overall heart size and pulmonary vascularity are normal. IMPRESSION: 1. Mild bilateral interstitial pulmonary edema. 2. Tiny right apical pneumothorax. Electronically Signed   By: Francene Boyers M.D.   On: 09/17/2016 16:12     Assessment/Plan: S/P Procedure(s) (LRB): CORONARY ARTERY BYPASS GRAFTING (CABG), ON PUMP, TIMES THREE, USING LEFT INTERNAL MAMMARY ARTERY AND ENDOSCOPICALLY HARVESTED LEFT GREATER SAPHENOUS VEIN (N/A) MITRAL VALVE REPAIR (MVR) (N/A) MAZE (N/A) TRANSESOPHAGEAL ECHOCARDIOGRAM (TEE) (N/A)  1. CV- S/P MAZE for A. Fib, bradycardic under pacemaker- will continue Amiodarone drip today, possibly transition to oral tomorrow if nausea resolved, wean Dopamine as tolerated 2. Pulm- CXR with bilateral atelectasis, encouraged good use of IS, minimal chest tube output.. Can start Mucinex to help facilitate expectoration of sputum 3. Renal- creatinine at 1.14, lytes okay... May benefit from Lasix once off Dopamine 4. Expected post operative blood  loss anemia, mild hgb at 10.7 5. Thrombocytopenia, mild Plt at 120 will watch 6. CBGs- ok, patient not a diabetic will wean insulin drip today 7. Dispo- patient stable POD #1, continue Amiodarone drip today, wean Dopamine as tolerated, start Lovenox for DVT prophylaxis, proceed with debridement of lines and chest tubes, POD #1 progression orders     Lowella Dandy 09/18/2016 8:04 AM\  Patient seen and examined, agree with above Overall doing well.  DC swan and A line Wean dopamine Keep amiodarone IV today Dc chest tubes, mobilize  [employer]. Dorris Fetch, MD [facility] [phone]

## 2016-09-18 NOTE — Procedures (Signed)
Extubation Procedure Note  Patient Details:   Name: [name] Green DOB: 05-26-1945 MRN: [id number]   Airway Documentation:   NIF -30  VC 1.6L.  Pt was extubated to 4L/[place].  SATS are in the upper 90's.  RT will continue to monitor.    Evaluation  O2 sats: stable throughout Complications: No apparent complications Patient did tolerate procedure well. Bilateral Breath Sounds: Diminished   Yes  [name] Green 09/18/2016, 12:21 AM

## 2016-09-18 NOTE — Care Management Note (Addendum)
Case Management Note  Patient Details  Name: [name] Green MRN: [id number] Date of Birth: [birth year]  Subjective/Objective:  From home, pta indep, S/p CABG, and MVR, extubated today, conts with chest tubes.. He has three daughters that are able to assist him at home, he states they will all take shifts to be with him after discharge.  He has medication coverage and a PCP.                 Action/Plan: NCM will follow for dc needs.  Expected Discharge Date:                  Expected Discharge Plan:     In-House Referral:     Discharge planning Services  CM Consult  Post Acute Care Choice:    Choice offered to:     DME Arranged:    DME Agency:     HH Arranged:    HH Agency:     Status of Service:  In process, will continue to follow  If discussed at Long Length of Stay Meetings, dates discussed:    Additional Comments:  [name], Jann Ra Clinton, RN 09/18/2016, 4:58 PM

## 2016-09-18 NOTE — Op Note (Signed)
NAME:  [name] Green, SEOANE NO.:  [id number]  MEDICAL RECORD NO.:  [id number]  LOCATION:                                 FACILITY:  PHYSICIAN:  Salvatore Decent. Dorris Fetch, M.D. DATE OF BIRTH:  DATE OF PROCEDURE:  09/17/2016 DATE OF DISCHARGE:                              OPERATIVE REPORT   PREOPERATIVE DIAGNOSIS:  Severe 2-vessel coronary artery disease with moderately severe mitral regurgitation and atrial fibrillation.  POSTOPERATIVE DIAGNOSIS:  Severe 2-vessel coronary artery disease with moderately severe mitral regurgitation and atrial fibrillation.  PROCEDURES:   Median sternotomy, extracorporeal circulation, Coronary artery bypass grafting x 3  Left internal mammary artery to LAD,  Saphenous vein graft to second diagonal,  Saphenous vein graft to posterior descending, Endoscopic vein harvest of right leg, Mitral valve repair with 30-mm physio II annuloplasty ring, Left and right modified Cox Maze IV using radiofrequency ablation and cryoablation.  SURGEON:  Salvatore Decent. Dorris Fetch, MD.  ASSISTANTS:  Rowe Clack, PA-C.  ANESTHESIA:  General.  FINDINGS:  Mammary and saphenous vein good quality. Posterior descending fair quality. LAD and diagonal good quality.  Moderately severe mitral regurgitation with central jet on prebypass TEE.  No residual MR post repair.  CLINICAL NOTE:  [name] is a 71 year old gentleman with multiple cardiac risk factors, who presented with progressive shortness of breath on exertion.  In May, he was found to have atrial flutter with rapid ventricular response.  An echocardiogram in July showed moderate to severe mitral regurgitation.  Ejection fraction was approximately 30% with inferior akinesis and global hypokinesis.  Cardiac catheterization revealed 2-vessel disease involving the LAD and right coronary artery. The patient received conflicting opinions in [facility] and sought a second surgical opinion in [place].  I  recommended surgical revascularization as well as mitral valve repair and a maze procedure to treat his atrial fibrillation.  The indications, risks, benefits, and alternatives were discussed in detail with the patient.  He understood and accepted the risks and agreed to proceed.  OPERATIVE NOTE:  [name] Green was brought to the preoperative holding area on September 17, 2016.  Anesthesia placed a Swan-Ganz catheter and arterial blood pressure monitoring line.  He was taken to the operating room, anesthetized, and intubated.  A Foley catheter was placed. Transesophageal echocardiography was performed.  Please refer to Dr. Deirdre Priest separately dictated note for full details.  There was global hypokinesis with inferior akinesis.  There was moderate to severe mitral regurgitation.  The chest, abdomen, and legs were prepped and draped in usual sterile fashion.  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  An incision was made over the greater saphenous vein at the level of the knee on the left leg and the vein was of good quality.  The vein was harvested from the left thigh endoscopically.  Simultaneously, a median sternotomy was performed.  The left internal mammary artery was harvested using standard technique.  2000 units of heparin was administered during the vessel harvest.  After harvesting the conduits, the remainder of the full heparin dose was given.  A sternal retractor was placed.  The pericardium was opened.  The ascending aorta was inspected.  There was no evidence of atherosclerotic disease.  After confirming adequate anticoagulation with ACT measurement, the aorta was cannulated via concentric 2-0 Ethibond pledgeted pursestring sutures.  A 31-French right-angle cannula was placed via pursestring suture in the superior vena cava. Cardiopulmonary bypass was initiated.  A 36-French malleable cannula was placed via a pursestring  suture in the inferior aspect of the right atrium and directed into the inferior vena cava.  This was connected to the bypass circuit and full flow was initiated.  The caval tapes were placed, but only tightened during the open portion of the procedure. The coronary arteries were inspected and anastomotic sites were chosen. The conduits were inspected and cut to length.  A foam pad was placed in the pericardium to insulate the heart.  A temperature probe was placed in myocardial septum.  A retrograde cardioplegia cannula was placed via a pursestring suture in the right atrium and directed into the coronary sinus.  An antegrade cardioplegia cannula was placed in the ascending aorta. Carbon dioxide was insufflated into the operative field.  The radiofrequency bipolar AtriCure device was used to ablate the left atrium at the confluence of the left-sided pulmonary veins and the base of the left atrial appendage prior to cross- clamping the aorta.  In each case when the radiofrequency ablation device was used, 2 parallel ablation lines were performed with transmurality confirmed on both.  The aorta was crossclamped.  The left ventricle was emptied via the aortic root vent.  Cardiac arrest then was achieved with combination of cold antegrade blood cardioplegia and retrograde blood cardioplegia and topical iced saline.  An initial 500 mL of cardioplegia was administered antegrade.  There was a rapid diastolic arrest.  An additional 600 mL of cardioplegia was administered retrograde.  There was septal cooling to less than 10 degrees Celsius.  Additional cardioplegia was administered at the completion of each vein graft as well as 20-minute intervals during the open portion of the procedure.  A reversed saphenous vein graft was placed end-to-side to the posterior descending.  This vessel did accept a 1.5-mm probe.  It was fair quality.  The vein was anastomosed end-to-side with a running  7-0 Prolene suture.  Probe passed easily proximally and distally. Cardioplegia was administered, and there were good flow and good hemostasis.  Next, a reversed saphenous vein graft was placed end-to-side to the first diagonal branch of the LAD.  This was a 1.5-mm good quality target.  The vein was of good quality.  The end-to-side anastomosis was performed with a running 7-0 Prolene suture.  Probe passed easily proximally and distally, and there were good flow and good hemostasis with cardioplegia administration.  The left internal mammary artery was beveled.  It was anastomosed end-to- side to the distal LAD.  The LAD and mammary were both 2-mm, good quality vessels.  The end-to-side anastomosis was performed with a running 8-0 Prolene suture.  At the completion of anastomosis, the bulldog clamp was briefly removed to inspect for hemostasis.  Septal rewarming was noted.  The bulldog clamp was replaced.  The mammary pedicle was tacked to the epicardial surface of the heart with 6-0 Prolene sutures.  Additional cardioplegia was administered both antegrade and retrograde.  The interatrial groove was dissected out.  The caval tapes were tightened.  A left atriotomy was performed.  The radiofrequency ablation was used to complete the lesion at the confluence of the right- sided pulmonary veins again using parallel ablation lines.  The mitral valve was inspected.  There was no significant prolapse.  The leaflet edges were thickened.  The anterior leaflet sized for a 30-mm Physio annuloplasty ring.   The left atrial appendage was oversewn with a 4-0 Prolene suture.  The cryoprobe then was used to create the Maze lesions to the 4 o'clock position on the mitral valve and to the base of the left atrial appendage. The cryoprobe was taken to -70 degrees Celsius for 2 minutes.     2-0 Ethibond horizontal sutures were placed in the mitral anulus circumferentially.  A total of 11 sutures were  used.  The commissural sutures also sized for a 30-mm ring.  The sutures were placed through the annuloplasty ring, which was lowered into place and the sutures were sequentially tied.  The left ventricle was distended with iced saline with no residual leakage from the mitral valve.The left atriotomy was closed in 2 layers with a running 4-0 Prolene suture. After completion of the first layer, de-airing was performed before tying the suture.  The tip of the right atrial appendage was excised, and a right atriotomy was performed.  The bipolar device was used to create lesions extending to the junction of the right atrium with the superior and inferior venae cavae and to the tip of the right atrial appendage.  The ablation line between the appendage and the tricuspid valve was performed using the cryoprobe as was the lesion to the coronary sinus.  The right atriotomies were closed with 2 layers of 4-0 Prolene suture.  The cardioplegia cannula was removed from the ascending aorta.  The vein grafts were cut to length.  The proximal vein graft anastomoses were performed to 4.5-mm punch aortotomies with running 6-0 Prolene sutures. At the completion of the final proximal anastomosis, the patient was placed in Trendelenburg position.  Lidocaine was administered.  The left ventricle and aortic root were de-aired, and the aortic crossclamp was removed.  The total crossclamp time was 135 minutes.  While rewarming was completed, all proximal and distal anastomoses were inspected for hemostasis.  Epicardial pacing wires were placed on the right ventricle and right atrium.  DDD pacing was initiated.  A dopamine infusion was initiated at 3 mcg per kg per minute.  The superior vena caval cannula was redirected into the right atrium.  The inferior vena cava cannula and retrograde cardioplegia cannula were removed.  There was good hemostasis of both these sites.  When the patient had rewarmed to a  core temperature of 37 degrees Celsius, he was weaned from cardiopulmonary bypass on the first attempt.  The initial cardiac index was greater than 2 L/min/m2.  Transesophageal echocardiography revealed no residual mitral regurgitation.  Left ventricular function was essentially unchanged from the prebypass study.  The patient did have some issues with ventricular sensing and capture.  A bipolar pacing wire was placed on the inferior aspect of the right atrium and connected to the pacer. This functioned better than the initial epicardial wires which were left in place.  A test dose of protamine was administered and it was well tolerated. The atrial and aortic cannulae were removed.  The remainder of the protamine was administered without incident.  The chest was irrigated with warm saline.  Hemostasis was achieved.  Left pleural and mediastinal chest tubes were placed through separate subcostal incisions.  The sternum was closed with a combination of single and double heavy gauge stainless steel wires.  The pectoralis fascia, subcutaneous tissue, and skin were closed  in standard fashion.  All sponge, needle, and instrument counts were correct at the end of the procedure.  The patient was taken from the operating room to the surgical intensive care unit intubated in critical but stable condition.     Salvatore DecentSteven C. Dorris FetchHendrickson, M.D.     SCH/MEDQ  D:  09/18/2016  T:  09/18/2016  Job:  [id number]

## 2016-09-18 NOTE — Progress Notes (Signed)
Notified Erin Barrett,P.A. regarding Mediastinal and Pleural chest tube output of 630 CCs s/p dangle and stand at bedside and low level #1 air leak prior to discontinuation.  Chest tubes currently remain in place and re-dressed.  Jenelle MagesSarah Burnham,RN at bedside assisting.  Will continue to monitor for changes and inform as needed.

## 2016-09-18 NOTE — Progress Notes (Signed)
[facility] BRIEF SICU PROGRESS NOTE  1 Day Post-Op  S/P Procedure(s) (LRB): CORONARY ARTERY BYPASS GRAFTING (CABG), ON PUMP, TIMES THREE, USING LEFT INTERNAL MAMMARY ARTERY AND ENDOSCOPICALLY HARVESTED LEFT GREATER SAPHENOUS VEIN (N/A) MITRAL VALVE REPAIR (MVR) (N/A) MAZE (N/A) TRANSESOPHAGEAL ECHOCARDIOGRAM (TEE) (N/A)   Stable day AAI paced w/ stable BP Breathing comfortably w/ O2 satsy 99-100% UOP adequate Labs okay  Plan: Continue current plan  Purcell Nailslarence H Bellamy Judson, MD 09/18/2016 6:26 PM

## 2016-09-19 ENCOUNTER — Inpatient Hospital Stay (HOSPITAL_COMMUNITY): Payer: Medicare Other

## 2016-09-19 LAB — POCT I-STAT 3, ART BLOOD GAS (G3+)
Acid-base deficit: 3 mmol/L — ABNORMAL HIGH (ref 0.0–2.0)
BICARBONATE: 22.3 mmol/L (ref 20.0–28.0)
O2 SAT: 91 %
TCO2: 24 mmol/L (ref 22–32)
pCO2 arterial: 38.9 mmHg (ref 32.0–48.0)
pH, Arterial: 7.365 (ref 7.350–7.450)
pO2, Arterial: 62 mmHg — ABNORMAL LOW (ref 83.0–108.0)

## 2016-09-19 LAB — CBC
HCT: 29.9 % — ABNORMAL LOW (ref 39.0–52.0)
Hemoglobin: 9.4 g/dL — ABNORMAL LOW (ref 13.0–17.0)
MCH: 26.6 pg (ref 26.0–34.0)
MCHC: 31.4 g/dL (ref 30.0–36.0)
MCV: 84.7 fL (ref 78.0–100.0)
PLATELETS: 82 10*3/uL — AB (ref 150–400)
RBC: 3.53 MIL/uL — ABNORMAL LOW (ref 4.22–5.81)
RDW: 16.1 % — ABNORMAL HIGH (ref 11.5–15.5)
WBC: 11.6 10*3/uL — ABNORMAL HIGH (ref 4.0–10.5)

## 2016-09-19 LAB — BASIC METABOLIC PANEL
Anion gap: 5 (ref 5–15)
BUN: 32 mg/dL — AB (ref 6–20)
CALCIUM: 8.3 mg/dL — AB (ref 8.9–10.3)
CO2: 23 mmol/L (ref 22–32)
CREATININE: 1.32 mg/dL — AB (ref 0.61–1.24)
Chloride: 105 mmol/L (ref 101–111)
GFR calc Af Amer: >60 mL/min (ref >60)
GFR calc non Af Amer: 52 mL/min — ABNORMAL LOW (ref >60)
GLUCOSE: 158 mg/dL — AB (ref 65–99)
Potassium: 4.6 mmol/L (ref 3.5–5.1)
Sodium: 133 mmol/L — ABNORMAL LOW (ref 135–145)

## 2016-09-19 LAB — GLUCOSE, CAPILLARY
GLUCOSE-CAPILLARY: 153 mg/dL — AB (ref 65–99)
GLUCOSE-CAPILLARY: 143 mg/dL — AB (ref 65–99)
GLUCOSE-CAPILLARY: 131 mg/dL — AB (ref 65–99)
Glucose-Capillary: 101 mg/dL — ABNORMAL HIGH (ref 65–99)
Glucose-Capillary: 119 mg/dL — ABNORMAL HIGH (ref 65–99)
Glucose-Capillary: 116 mg/dL — ABNORMAL HIGH (ref 65–99)
Glucose-Capillary: 117 mg/dL — ABNORMAL HIGH (ref 65–99)

## 2016-09-19 MED ORDER — ASPIRIN EC 81 MG PO TBEC
81.0000 mg | DELAYED_RELEASE_TABLET | Freq: Every day | ORAL | Status: DC
  Administered 2016-09-19 – 2016-09-24 (×6): 81 mg via ORAL
  Filled 2016-09-19 (×6): qty 1

## 2016-09-19 MED ORDER — METOCLOPRAMIDE HCL 5 MG/ML IJ SOLN
10.0000 mg | Freq: Four times a day (QID) | INTRAMUSCULAR | Status: DC
  Administered 2016-09-19 – 2016-09-21 (×8): 10 mg via INTRAVENOUS
  Filled 2016-09-19 (×8): qty 2

## 2016-09-19 MED ORDER — PROMETHAZINE HCL 25 MG/ML IJ SOLN
12.5000 mg | Freq: Four times a day (QID) | INTRAMUSCULAR | Status: DC | PRN
  Administered 2016-09-19: 12.5 mg via INTRAVENOUS
  Filled 2016-09-19: qty 1

## 2016-09-19 MED ORDER — ATORVASTATIN CALCIUM 40 MG PO TABS
40.0000 mg | ORAL_TABLET | Freq: Every day | ORAL | Status: DC
Start: 2016-09-19 — End: 2016-09-24
  Administered 2016-09-19 – 2016-09-24 (×6): 40 mg via ORAL
  Filled 2016-09-19 (×6): qty 1

## 2016-09-19 MED ORDER — ORAL CARE MOUTH RINSE
15.0000 mL | Freq: Two times a day (BID) | OROMUCOSAL | Status: DC
  Administered 2016-09-19 – 2016-09-24 (×8): 15 mL via OROMUCOSAL

## 2016-09-19 NOTE — Op Note (Signed)
NAME:  [name] Green, [place]                 ACCOUNT NO.:  [id number]  MEDICAL RECORD NO.:  [id number]  LOCATION:                                 FACILITY:  PHYSICIAN:  Salvatore DecentSteven C. Dorris FetchHendrickson, M.D. DATE OF BIRTH:  DATE OF PROCEDURE:  09/17/2016 DATE OF DISCHARGE:                              OPERATIVE REPORT   PREOPERATIVE DIAGNOSIS:  Two-vessel coronary artery disease with moderately severe mitral regurgitation and atrial fibrillation.  POSTOPERATIVE DIAGNOSIS:  Two-vessel coronary artery disease with moderately severe mitral regurgitation and atrial fibrillation.  PROCEDURE: Dictation ended at this point.     Salvatore DecentSteven C. Dorris FetchHendrickson, M.D.     SCH/MEDQ  D:  09/18/2016  T:  09/19/2016  Job:  [id number]   See fully dictate op note

## 2016-09-19 NOTE — Progress Notes (Signed)
2 Days Post-Op Procedure(s) (LRB): CORONARY ARTERY BYPASS GRAFTING (CABG), ON PUMP, TIMES THREE, USING LEFT INTERNAL MAMMARY ARTERY AND ENDOSCOPICALLY HARVESTED LEFT GREATER SAPHENOUS VEIN (N/A) MITRAL VALVE REPAIR (MVR) (N/A) MAZE (N/A) TRANSESOPHAGEAL ECHOCARDIOGRAM (TEE) (N/A) Subjective: C/o pain and nausea  Objective: Vital signs in last 24 hours: Temp:  [97.3 F (36.3 C)-97.8 F (36.6 C)] 97.8 F (36.6 C) (09/06 2344) Pulse Rate:  [39-101] 39 (09/07 0730) Cardiac Rhythm: A-V Sequential paced (09/07 0552) Resp:  [10-24] 14 (09/07 0730) BP: (72-118)/(52-88) 96/73 (09/07 0730) SpO2:  [91 %-100 %] 100 % (09/07 0730) Arterial Line BP: (70-122)/(35-59) 103/46 (09/07 0730) Weight:  [222 lb 14.2 oz (101.1 kg)] 222 lb 14.2 oz (101.1 kg) (09/07 0300)  Hemodynamic parameters for last 24 hours: PAP: (45-49)/(27-28) 49/27 CO:  [4.6 L/min] 4.6 L/min CI:  [2.1 L/min/m2] 2.1 L/min/m2  Intake/Output from previous day: 09/06 0701 - 09/07 0700 In: 2727.3 [P.O.:290; I.V.:2337.3; IV Piggyback:100] Out: 2280 [Urine:1010; Chest Tube:1270] Intake/Output this shift: No intake/output data recorded.  General appearance: alert and mild distress Neurologic: intact Heart: regular rate and rhythm Lungs: diminished breath sounds bibasilar Abdomen: normal findings: soft + BS, nontender ? tiny air leak  Lab Results:  Recent Labs  09/18/16 1700 09/18/16 1722 09/19/16 0335  WBC 12.3*  --  11.6*  HGB 9.7* 10.2* 9.4*  HCT 31.1* 30.0* 29.9*  PLT 99*  --  82*   BMET:  Recent Labs  09/18/16 0401  09/18/16 1722 09/19/16 0335  NA 138  --  137 133*  K 4.5  --  4.9 4.6  CL 112*  --  105 105  CO2 21*  --   --  23  GLUCOSE 136*  --  157* 158*  BUN 21*  --  28* 32*  CREATININE 1.14  < > 1.10 1.32*  CALCIUM 7.9*  --   --  8.3*  < > = values in this interval not displayed.  PT/INR:  Recent Labs  09/17/16 1555  LABPROT 16.5*  INR 1.34   ABG    Component Value Date/Time   PHART 7.365  09/19/2016 0548   HCO3 22.3 09/19/2016 0548   TCO2 24 09/19/2016 0548   ACIDBASEDEF 3.0 (H) 09/19/2016 0548   O2SAT 91.0 09/19/2016 0548   CBG (last 3)   Recent Labs  09/18/16 1939 09/18/16 2342 09/19/16 0346  GLUCAP 167* 148* 153*    Assessment/Plan: S/P Procedure(s) (LRB): CORONARY ARTERY BYPASS GRAFTING (CABG), ON PUMP, TIMES THREE, USING LEFT INTERNAL MAMMARY ARTERY AND ENDOSCOPICALLY HARVESTED LEFT GREATER SAPHENOUS VEIN (N/A) MITRAL VALVE REPAIR (MVR) (N/A) MAZE (N/A) TRANSESOPHAGEAL ECHOCARDIOGRAM (TEE) (N/A) -CV- paced rhythm with brady underneath- dc amiodarone and lopressor  Resume Eliquis in a day or two when tubes out  Wean neo and dopamine as tolerated  -RESP- continue IS  Questionable air leak from CT- will leave tubes in on water seal and recheck later  -RENAL- creatinine up slightly, may be a reflection of BP being relatively low  -ENDO- CBG moderately elevated  -Thrombocytopenia- PLT down this AM- dc enoxaparin, check HIT  -GI- still having nausea- abdomen benign- will see if improves with amiodarone stopped   LOS: 2 days    Loreli SlotSteven C Tyrell Seifer 09/19/2016

## 2016-09-19 NOTE — Progress Notes (Signed)
      [address]       [place] [id number]             [phone]       Feels better this evening, nausea improved  BP 112/84   Pulse 92   Temp 98.6 F (37 C) (Oral)   Resp 15   Ht 6\' 1"  (1.854 m)   Wt 222 lb 14.2 oz (101.1 kg)   SpO2 100%   BMI 29.41 kg/m    Intake/Output Summary (Last 24 hours) at 09/19/16 1851 Last data filed at 09/19/16 1700  Gross per 24 hour  Intake          1675.65 ml  Output             1330 ml  Net           345.65 ml   CBG oK  Off neo  Viviann SpareSteven C. Dorris FetchHendrickson, MD [facility] [phone]

## 2016-09-20 ENCOUNTER — Inpatient Hospital Stay (HOSPITAL_COMMUNITY): Payer: Medicare Other

## 2016-09-20 LAB — BASIC METABOLIC PANEL
ANION GAP: 5 (ref 5–15)
BUN: 37 mg/dL — ABNORMAL HIGH (ref 6–20)
CALCIUM: 8.4 mg/dL — AB (ref 8.9–10.3)
CHLORIDE: 103 mmol/L (ref 101–111)
CO2: 24 mmol/L (ref 22–32)
Creatinine, Ser: 1.33 mg/dL — ABNORMAL HIGH (ref 0.61–1.24)
GFR, EST AFRICAN AMERICAN: 60 mL/min — AB (ref >60)
GFR, EST NON AFRICAN AMERICAN: 51 mL/min — AB (ref >60)
Glucose, Bld: 101 mg/dL — ABNORMAL HIGH (ref 65–99)
POTASSIUM: 4.6 mmol/L (ref 3.5–5.1)
SODIUM: 132 mmol/L — AB (ref 135–145)

## 2016-09-20 LAB — CBC
HEMATOCRIT: 28.2 % — AB (ref 39.0–52.0)
HEMOGLOBIN: 9 g/dL — AB (ref 13.0–17.0)
MCH: 27 pg (ref 26.0–34.0)
MCHC: 31.9 g/dL (ref 30.0–36.0)
MCV: 84.7 fL (ref 78.0–100.0)
Platelets: 63 10*3/uL — ABNORMAL LOW (ref 150–400)
RBC: 3.33 MIL/uL — AB (ref 4.22–5.81)
RDW: 15.9 % — ABNORMAL HIGH (ref 11.5–15.5)
WBC: 6.6 10*3/uL (ref 4.0–10.5)

## 2016-09-20 LAB — GLUCOSE, CAPILLARY
GLUCOSE-CAPILLARY: 95 mg/dL (ref 65–99)
GLUCOSE-CAPILLARY: 79 mg/dL (ref 65–99)
GLUCOSE-CAPILLARY: 109 mg/dL — AB (ref 65–99)
Glucose-Capillary: 97 mg/dL (ref 65–99)
Glucose-Capillary: 124 mg/dL — ABNORMAL HIGH (ref 65–99)

## 2016-09-20 LAB — HEPARIN INDUCED PLATELET AB (HIT ANTIBODY): HEPARIN INDUCED PLT AB: 0.08 {OD_unit} (ref 0.000–0.400)

## 2016-09-20 MED ORDER — INSULIN ASPART 100 UNIT/ML ~~LOC~~ SOLN
0.0000 [IU] | Freq: Three times a day (TID) | SUBCUTANEOUS | Status: DC
  Administered 2016-09-20 – 2016-09-24 (×4): 2 [IU] via SUBCUTANEOUS

## 2016-09-20 MED ORDER — FUROSEMIDE 10 MG/ML IJ SOLN
40.0000 mg | Freq: Once | INTRAMUSCULAR | Status: AC
  Administered 2016-09-20: 40 mg via INTRAVENOUS
  Filled 2016-09-20: qty 4

## 2016-09-20 MED ORDER — ROPINIROLE HCL 1 MG PO TABS
0.5000 mg | ORAL_TABLET | Freq: Three times a day (TID) | ORAL | Status: DC | PRN
  Administered 2016-09-20 – 2016-09-22 (×2): 0.5 mg via ORAL
  Filled 2016-09-20 (×2): qty 1

## 2016-09-20 NOTE — Progress Notes (Signed)
3 Days Post-Op Procedure(s) (LRB): CORONARY ARTERY BYPASS GRAFTING (CABG), ON PUMP, TIMES THREE, USING LEFT INTERNAL MAMMARY ARTERY AND ENDOSCOPICALLY HARVESTED LEFT GREATER SAPHENOUS VEIN (N/A) MITRAL VALVE REPAIR (MVR) (N/A) MAZE (N/A) TRANSESOPHAGEAL ECHOCARDIOGRAM (TEE) (N/A) Subjective: Feels better this AM  Objective: Vital signs in last 24 hours: Temp:  [98.2 F (36.8 C)-99.7 F (37.6 C)] 98.8 F (37.1 C) (09/08 0746) Pulse Rate:  [85-92] 87 (09/08 0500) Cardiac Rhythm: A-V Sequential paced (09/08 0000) Resp:  [10-23] 13 (09/08 0700) BP: (86-130)/(42-108) 119/53 (09/08 0703) SpO2:  [92 %-100 %] 96 % (09/08 0700) Arterial Line BP: (84-104)/(36-44) 94/36 (09/07 1500) Weight:  [223 lb 1.7 oz (101.2 kg)] 223 lb 1.7 oz (101.2 kg) (09/08 0530)  Hemodynamic parameters for last 24 hours:    Intake/Output from previous day: 09/07 0701 - 09/08 0700 In: 799.1 [P.O.:100; I.V.:599.1; IV Piggyback:100] Out: 1455 [Urine:1285; Chest Tube:170] Intake/Output this shift: No intake/output data recorded.  General appearance: alert, cooperative and no distress Neurologic: intact Heart: regularly irregular rhythm Lungs: diminished breath sounds bibasilar Abdomen: normal findings: soft, non-tender  Lab Results:  Recent Labs  09/19/16 0335 09/20/16 0316  WBC 11.6* 6.6  HGB 9.4* 9.0*  HCT 29.9* 28.2*  PLT 82* 63*   BMET:  Recent Labs  09/19/16 0335 09/20/16 0316  NA 133* 132*  K 4.6 4.6  CL 105 103  CO2 23 24  GLUCOSE 158* 101*  BUN 32* 37*  CREATININE 1.32* 1.33*  CALCIUM 8.3* 8.4*    PT/INR:  Recent Labs  09/17/16 1555  LABPROT 16.5*  INR 1.34   ABG    Component Value Date/Time   PHART 7.365 09/19/2016 0548   HCO3 22.3 09/19/2016 0548   TCO2 24 09/19/2016 0548   ACIDBASEDEF 3.0 (H) 09/19/2016 0548   O2SAT 91.0 09/19/2016 0548   CBG (last 3)   Recent Labs  09/19/16 1944 09/20/16 0338 09/20/16 0743  GLUCAP 131* 95 97    Assessment/Plan: S/P  Procedure(s) (LRB): CORONARY ARTERY BYPASS GRAFTING (CABG), ON PUMP, TIMES THREE, USING LEFT INTERNAL MAMMARY ARTERY AND ENDOSCOPICALLY HARVESTED LEFT GREATER SAPHENOUS VEIN (N/A) MITRAL VALVE REPAIR (MVR) (N/A) MAZE (N/A) TRANSESOPHAGEAL ECHOCARDIOGRAM (TEE) (N/A) Looks much better this AM  CV- in a junctional rhythm with PVCs- metoprolol and amiodarone dc'ed - follow  Wean dopamine off now that BP is better  Will wait one more day before resuming Eliquis  RESP- continue IS, no air leak- dc CT  RENAL- creatinine stable, BP better will give IV lasix this AM, dc Foley later today  ENDO- CBG normal- dc levemir and change to AC and HS  HEME- thrombocytopenia persists- enoxaparin dc'ed, HIT panel pending  Continue cardiac rehab   LOS: 3 days    Loreli SlotSteven C Camille Dragan 09/20/2016

## 2016-09-20 NOTE — Progress Notes (Signed)
      [address]       [place] [id number]             [phone]      C/o "restless legs"  BP 135/64   Pulse 71   Temp 98.7 F (37.1 C) (Oral)   Resp 15   Ht 6\' 1"  (1.854 m)   Wt 223 lb 1.7 oz (101.2 kg)   SpO2 98%   BMI 29.44 kg/m    Intake/Output Summary (Last 24 hours) at 09/20/16 1926 Last data filed at 09/20/16 1703  Gross per 24 hour  Intake          1281.65 ml  Output             1695 ml  Net          -413.35 ml    Will try Requip  [name] Leising C. Dorris FetchHendrickson, MD [facility] [phone]

## 2016-09-21 ENCOUNTER — Inpatient Hospital Stay (HOSPITAL_COMMUNITY): Payer: Medicare Other

## 2016-09-21 LAB — BASIC METABOLIC PANEL
Anion gap: 6 (ref 5–15)
BUN: 37 mg/dL — ABNORMAL HIGH (ref 6–20)
CALCIUM: 8.5 mg/dL — AB (ref 8.9–10.3)
CO2: 24 mmol/L (ref 22–32)
CREATININE: 1.24 mg/dL (ref 0.61–1.24)
Chloride: 104 mmol/L (ref 101–111)
GFR calc non Af Amer: 56 mL/min — ABNORMAL LOW (ref >60)
Glucose, Bld: 110 mg/dL — ABNORMAL HIGH (ref 65–99)
Potassium: 4.4 mmol/L (ref 3.5–5.1)
SODIUM: 134 mmol/L — AB (ref 135–145)

## 2016-09-21 LAB — GLUCOSE, CAPILLARY
GLUCOSE-CAPILLARY: 111 mg/dL — AB (ref 65–99)
GLUCOSE-CAPILLARY: 107 mg/dL — AB (ref 65–99)
Glucose-Capillary: 121 mg/dL — ABNORMAL HIGH (ref 65–99)
Glucose-Capillary: 132 mg/dL — ABNORMAL HIGH (ref 65–99)

## 2016-09-21 LAB — CBC
HCT: 28.3 % — ABNORMAL LOW (ref 39.0–52.0)
HEMOGLOBIN: 9 g/dL — AB (ref 13.0–17.0)
MCH: 26.6 pg (ref 26.0–34.0)
MCHC: 31.8 g/dL (ref 30.0–36.0)
MCV: 83.7 fL (ref 78.0–100.0)
Platelets: 69 10*3/uL — ABNORMAL LOW (ref 150–400)
RBC: 3.38 MIL/uL — ABNORMAL LOW (ref 4.22–5.81)
RDW: 16.1 % — AB (ref 11.5–15.5)
WBC: 4.9 10*3/uL (ref 4.0–10.5)

## 2016-09-21 MED ORDER — APIXABAN 5 MG PO TABS
5.0000 mg | ORAL_TABLET | Freq: Two times a day (BID) | ORAL | Status: DC
  Administered 2016-09-21 – 2016-09-23 (×6): 5 mg via ORAL
  Filled 2016-09-21 (×6): qty 1

## 2016-09-21 MED ORDER — FUROSEMIDE 40 MG PO TABS
40.0000 mg | ORAL_TABLET | Freq: Every day | ORAL | Status: DC
  Administered 2016-09-21 – 2016-09-24 (×4): 40 mg via ORAL
  Filled 2016-09-21 (×5): qty 1

## 2016-09-21 MED ORDER — POTASSIUM CHLORIDE CRYS ER 20 MEQ PO TBCR
20.0000 meq | EXTENDED_RELEASE_TABLET | Freq: Every day | ORAL | Status: DC
  Administered 2016-09-21 – 2016-09-22 (×2): 20 meq via ORAL
  Filled 2016-09-21 (×3): qty 1

## 2016-09-21 NOTE — Progress Notes (Signed)
      [address]       [place] [id number]             [phone]      Sleeping at present  BP (!) 147/93   Pulse 72   Temp 98.6 F (37 C) (Oral)   Resp 18   Ht 6\' 1"  (1.854 m)   Wt 207 lb 10.8 oz (94.2 kg)   SpO2 100%   BMI 27.40 kg/m    Intake/Output Summary (Last 24 hours) at 09/21/16 1810 Last data filed at 09/21/16 1702  Gross per 24 hour  Intake              720 ml  Output             1450 ml  Net             -730 ml   No new issues  Awaiting tele bed  Viviann SpareSteven C. Dorris FetchHendrickson, MD [facility] [phone]

## 2016-09-21 NOTE — Discharge Instructions (Addendum)
Information on my medicine - ELIQUIS (apixaban)  This medication education was reviewed with me or my healthcare representative as part of my discharge preparation.  The pharmacist that spoke with me during my hospital stay was:  Mosetta AnisMichael T Bitonti, [facility]  Why was Eliquis prescribed for you? Eliquis was prescribed for you to reduce the risk of a blood clot forming that can cause a stroke if you have a medical condition called atrial fibrillation (a type of irregular heartbeat).  What do You need to know about Eliquis ? Take your Eliquis TWICE DAILY - one tablet in the morning and one tablet in the evening with or without food. If you have difficulty swallowing the tablet whole please discuss with your pharmacist how to take the medication safely.  Take Eliquis exactly as prescribed by your doctor and DO NOT stop taking Eliquis without talking to the doctor who prescribed the medication.  Stopping may increase your risk of developing a stroke.  Refill your prescription before you run out.  After discharge, you should have regular check-up appointments with your healthcare provider that is prescribing your Eliquis.  In the future your dose may need to be changed if your kidney function or weight changes by a significant amount or as you get older.  What do you do if you miss a dose? If you miss a dose, take it as soon as you remember on the same day and resume taking twice daily.  Do not take more than one dose of ELIQUIS at the same time to make up a missed dose.  Important Safety Information A possible side effect of Eliquis is bleeding. You should call your healthcare provider right away if you experience any of the following: ? Bleeding from an injury or your nose that does not stop. ? Unusual colored urine (red or dark brown) or unusual colored stools (red or black). ? Unusual bruising for unknown reasons. ? A serious fall or if you hit your head (even if there is no bleeding).  Some  medicines may interact with Eliquis and might increase your risk of bleeding or clotting while on Eliquis. To help avoid this, consult your healthcare provider or pharmacist prior to using any new prescription or non-prescription medications, including herbals, vitamins, non-steroidal anti-inflammatory drugs (NSAIDs) and supplements.  This website has more information on Eliquis (apixaban): http://www.eliquis.com/eliquis/home   Coronary Artery Bypass Grafting, Care After This sheet gives you information about how to care for yourself after your procedure. Your health care provider may also give you more specific instructions. If you have problems or questions, contact your health care provider. What can I expect after the procedure? After the procedure, it is common to have:  Nausea and a lack of appetite.  Constipation.  Weakness and fatigue.  Depression or irritability.  Pain or discomfort in your incision areas.  Follow these instructions at home: Medicines  Take over-the-counter and prescription medicines only as told by your health care provider. Do not stop taking medicines or start any new medicines without approval from your health care provider.  If you were prescribed an antibiotic medicine, take it as told by your health care provider. Do not stop taking the antibiotic even if you start to feel better.  Do not drive or use heavy machinery while taking prescription pain medicine. Incision care  Follow instructions from your health care provider about how to take care of your incisions. Make sure you: ? Wash your hands with soap and water before you  change your bandage (dressing). If soap and water are not available, use hand sanitizer. ? Change your dressing as told by your health care provider. ? Leave stitches (sutures), skin glue, or adhesive strips in place. These skin closures may need to stay in place for 2 weeks or longer. If adhesive strip edges start to loosen  and curl up, you may trim the loose edges. Do not remove adhesive strips completely unless your health care provider tells you to do that.  Keep incision areas clean, dry, and protected.  Check your incision areas every day for signs of infection. Check for: ? More redness, swelling, or pain. ? More fluid or blood. ? Warmth. ? Pus or a bad smell.  If incisions were made in your legs: ? Avoid crossing your legs. ? Avoid sitting for long periods of time. Change positions every 30 minutes. ? Raise (elevate) your legs when you are sitting. Bathing  Do not take baths, swim, or use a hot tub until your health care provider approves.  Only take sponge baths. Pat the incisions dry. Do not rub incisions with a washcloth or towel.  Ask your health care provider when you can shower. Eating and drinking  Eat foods that are high in fiber, such as raw fruits and vegetables, whole grains, beans, and nuts. Meats should be lean cut. Avoid canned, processed, and fried foods. This can help prevent constipation and is a recommended part of a heart-healthy diet.  Drink enough fluid to keep your urine clear or pale yellow.  Limit alcohol intake to no more than 1 drink a day for nonpregnant women and 2 drinks a day for men. One drink equals 12 oz of beer, 5 oz of wine, or 1 oz of hard liquor. Activity  Rest and limit your activity as told by your health care provider. You may be instructed to: ? Stop any activity right away if you have chest pain, shortness of breath, irregular heartbeats, or dizziness. Get help right away if you have any of these symptoms. ? Move around frequently for short periods or take short walks as directed by your health care provider. Gradually increase your activities. You may need physical therapy or cardiac rehabilitation to help strengthen your muscles and build your endurance. ? Avoid lifting, pushing, or pulling anything that is heavier than 10 lb (4.5 kg) for at least 6  weeks or as told by your health care provider.  Do not drive until your health care provider approves.  Ask your health care provider when you may return to work.  Ask your health care provider when you may resume sexual activity. General instructions  Do not use any products that contain nicotine or tobacco, such as cigarettes and e-cigarettes. If you need help quitting, ask your health care provider.  Take 2-3 deep breaths every few hours during the day, while you recover. This helps expand your lungs and prevent complications like pneumonia after surgery.  If you were given a device called an incentive spirometer, use it several times a day to practice deep breathing. Support your chest with a pillow or your arms when you take deep breaths or cough.  Wear compression stockings as told by your health care provider. These stockings help to prevent blood clots and reduce swelling in your legs.  Weigh yourself every day. This helps identify if your body is holding (retaining) fluid that may make your heart and lungs work harder.  Keep all follow-up visits as told by your  health care provider. This is important. Contact a health care provider if:  You have more redness, swelling, or pain around any incision.  You have more fluid or blood coming from any incision.  Any incision feels warm to the touch.  You have pus or a bad smell coming from any incision  You have a fever.  You have swelling in your ankles or legs.  You have pain in your legs.  You gain 2 lb (0.9 kg) or more a day.  You are nauseous or you vomit.  You have diarrhea. Get help right away if:  You have chest pain that spreads to your jaw or arms.  You are short of breath.  You have a fast or irregular heartbeat.  You notice a "clicking" in your breastbone (sternum) when you move.  You have numbness or weakness in your arms or legs.  You feel dizzy or light-headed. Summary  After the procedure, it is  common to have pain or discomfort in the incision areas.  Do not take baths, swim, or use a hot tub until your health care provider approves.  Gradually increase your activities. You may need physical therapy or cardiac rehabilitation to help strengthen your muscles and build your endurance.  Weigh yourself every day. This helps identify if your body is holding (retaining) fluid that may make your heart and lungs work harder. This information is not intended to replace advice given to you by your health care provider. Make sure you discuss any questions you have with your health care provider. Document Released: 07/19/2004 Document Revised: 11/19/2015 Document Reviewed: 11/19/2015 Elsevier Interactive Patient Education  2018 [employer]   Endoscopic Saphenous Vein Harvesting, Care After Refer to this sheet in the next few weeks. These instructions provide you with information about caring for yourself after your procedure. Your health care provider may also give you more specific instructions. Your treatment has been planned according to current medical practices, but problems sometimes occur. Call your health care provider if you have any problems or questions after your procedure. What can I expect after the procedure? After the procedure, it is common to have:  Pain.  Bruising.  Swelling.  Numbness.  Follow these instructions at home: Medicine  Take over-the-counter and prescription medicines only as told by your health care provider.  Do not drive or operate heavy machinery while taking prescription pain medicine. Incision care   Follow instructions from your health care provider about how to take care of the cut made during surgery (incision). Make sure you: ? Wash your hands with soap and water before you change your bandage (dressing). If soap and water are not available, use hand sanitizer. ? Change your dressing as told by your health care provider. ? Leave stitches  (sutures), skin glue, or adhesive strips in place. These skin closures may need to be in place for 2 weeks or longer. If adhesive strip edges start to loosen and curl up, you may trim the loose edges. Do not remove adhesive strips completely unless your health care provider tells you to do that.  Check your incision area every day for signs of infection. Check for: ? More redness, swelling, or pain. ? More fluid or blood. ? Warmth. ? Pus or a bad smell. General instructions  Raise (elevate) your legs above the level of your heart while you are sitting or lying down.  Do any exercises your health care providers have given you. These may include deep breathing, coughing, and  walking exercises.  Do not shower, take baths, swim, or use a hot tub unless told by your health care provider.  Wear your elastic stocking if told by your health care provider.  Keep all follow-up visits as told by your health care provider. This is important. Contact a health care provider if:  Medicine does not help your pain.  Your pain gets worse.  You have new leg bruises or your leg bruises get bigger.  You have a fever.  Your leg feels numb.  You have more redness, swelling, or pain around your incision.  You have more fluid or blood coming from your incision.  Your incision feels warm to the touch.  You have pus or a bad smell coming from your incision. Get help right away if:  Your pain is severe.  You develop pain, tenderness, warmth, redness, or swelling in any part of your leg.  You have chest pain.  You have trouble breathing. This information is not intended to replace advice given to you by your health care provider. Make sure you discuss any questions you have with your health care provider. Document Released: 09/11/2010 Document Revised: 06/07/2015 Document Reviewed: 11/13/2014 Elsevier Interactive Patient Education  2018 [employer].

## 2016-09-21 NOTE — Progress Notes (Signed)
4 Days Post-Op Procedure(s) (LRB): CORONARY ARTERY BYPASS GRAFTING (CABG), ON PUMP, TIMES THREE, USING LEFT INTERNAL MAMMARY ARTERY AND ENDOSCOPICALLY HARVESTED LEFT GREATER SAPHENOUS VEIN (N/A) MITRAL VALVE REPAIR (MVR) (N/A) MAZE (N/A) TRANSESOPHAGEAL ECHOCARDIOGRAM (TEE) (N/A) Subjective: No complaints this AM Not using pain meds much  Objective: Vital signs in last 24 hours: Temp:  [97.9 F (36.6 C)-99 F (37.2 C)] 98.5 F (36.9 C) (09/09 0730) Pulse Rate:  [37-89] 89 (09/09 0600) Cardiac Rhythm: Normal sinus rhythm (09/09 0400) Resp:  [11-24] 24 (09/09 0600) BP: (93-135)/(44-89) 119/58 (09/09 0500) SpO2:  [92 %-100 %] 97 % (09/09 0600) FiO2 (%):  [40 %] 40 % (09/08 1600) Weight:  [207 lb 10.8 oz (94.2 kg)] 207 lb 10.8 oz (94.2 kg) (09/09 0557)  Hemodynamic parameters for last 24 hours:    Intake/Output from previous day: 09/08 0701 - 09/09 0700 In: 851.3 [P.O.:720; I.V.:131.3] Out: 1335 [Urine:1295; Chest Tube:40] Intake/Output this shift: No intake/output data recorded.  General appearance: alert, cooperative and no distress Neurologic: intact Heart: regular rate and rhythm Lungs: diminished breath sounds bibasilar Abdomen: normal findings: soft, non-tender Wound: clean and dry  Lab Results:  Recent Labs  09/20/16 0316 09/21/16 0454  WBC 6.6 4.9  HGB 9.0* 9.0*  HCT 28.2* 28.3*  PLT 63* 69*   BMET:  Recent Labs  09/20/16 0316 09/21/16 0454  NA 132* 134*  K 4.6 4.4  CL 103 104  CO2 24 24  GLUCOSE 101* 110*  BUN 37* 37*  CREATININE 1.33* 1.24  CALCIUM 8.4* 8.5*    PT/INR: No results for input(s): LABPROT, INR in the last 72 hours. ABG    Component Value Date/Time   PHART 7.365 09/19/2016 0548   HCO3 22.3 09/19/2016 0548   TCO2 24 09/19/2016 0548   ACIDBASEDEF 3.0 (H) 09/19/2016 0548   O2SAT 91.0 09/19/2016 0548   CBG (last 3)   Recent Labs  09/20/16 1517 09/20/16 2155 09/21/16 0728  GLUCAP 79 109* 111*    Assessment/Plan: S/P  Procedure(s) (LRB): CORONARY ARTERY BYPASS GRAFTING (CABG), ON PUMP, TIMES THREE, USING LEFT INTERNAL MAMMARY ARTERY AND ENDOSCOPICALLY HARVESTED LEFT GREATER SAPHENOUS VEIN (N/A) MITRAL VALVE REPAIR (MVR) (N/A) MAZE (N/A) TRANSESOPHAGEAL ECHOCARDIOGRAM (TEE) (N/A) Plan for transfer to step-down: see transfer orders   CV- junctional rhythm in 70s with some pVCs- keep pacer at VVI at 50  Will resume Eliquis for atrial fib  RESP- continue IS  RENAL- creatinine improved, will start PO lasix and K  ENDO- CBG well controlled  Thrombocytopenia- PLT up slightly, HIT negative  Continue cardiac rehab   LOS: 4 days    Loreli SlotSteven C Hendrickson 09/21/2016

## 2016-09-22 ENCOUNTER — Inpatient Hospital Stay (HOSPITAL_COMMUNITY): Payer: Medicare Other

## 2016-09-22 LAB — CBC
HCT: 28.1 % — ABNORMAL LOW (ref 39.0–52.0)
Hemoglobin: 8.9 g/dL — ABNORMAL LOW (ref 13.0–17.0)
MCH: 26.3 pg (ref 26.0–34.0)
MCHC: 31.7 g/dL (ref 30.0–36.0)
MCV: 82.9 fL (ref 78.0–100.0)
PLATELETS: 124 10*3/uL — AB (ref 150–400)
RBC: 3.39 MIL/uL — ABNORMAL LOW (ref 4.22–5.81)
RDW: 15.6 % — AB (ref 11.5–15.5)
WBC: 6.4 10*3/uL (ref 4.0–10.5)

## 2016-09-22 LAB — GLUCOSE, CAPILLARY
GLUCOSE-CAPILLARY: 119 mg/dL — AB (ref 65–99)
GLUCOSE-CAPILLARY: 144 mg/dL — AB (ref 65–99)
Glucose-Capillary: 104 mg/dL — ABNORMAL HIGH (ref 65–99)
Glucose-Capillary: 111 mg/dL — ABNORMAL HIGH (ref 65–99)
Glucose-Capillary: 122 mg/dL — ABNORMAL HIGH (ref 65–99)

## 2016-09-22 LAB — BASIC METABOLIC PANEL
Anion gap: 7 (ref 5–15)
BUN: 28 mg/dL — ABNORMAL HIGH (ref 6–20)
CHLORIDE: 100 mmol/L — AB (ref 101–111)
CO2: 24 mmol/L (ref 22–32)
CREATININE: 1.13 mg/dL (ref 0.61–1.24)
Calcium: 8.5 mg/dL — ABNORMAL LOW (ref 8.9–10.3)
GFR calc non Af Amer: >60 mL/min (ref >60)
Glucose, Bld: 126 mg/dL — ABNORMAL HIGH (ref 65–99)
POTASSIUM: 4.1 mmol/L (ref 3.5–5.1)
SODIUM: 131 mmol/L — AB (ref 135–145)

## 2016-09-22 MED ORDER — MOVING RIGHT ALONG BOOK
Freq: Once | Status: AC
  Administered 2016-09-23: 08:00:00
  Filled 2016-09-22: qty 1

## 2016-09-22 MED ORDER — CLOPIDOGREL BISULFATE 75 MG PO TABS
75.0000 mg | ORAL_TABLET | Freq: Every day | ORAL | Status: DC
  Administered 2016-09-22 – 2016-09-24 (×3): 75 mg via ORAL
  Filled 2016-09-22 (×3): qty 1

## 2016-09-22 MED ORDER — ALUM & MAG HYDROXIDE-SIMETH 200-200-20 MG/5ML PO SUSP: 15.0000 mL | Freq: Four times a day (QID) | ORAL | Status: DC | PRN

## 2016-09-22 MED ORDER — SODIUM CHLORIDE 0.9% FLUSH: 3.0000 mL | INTRAVENOUS | Status: DC | PRN

## 2016-09-22 MED ORDER — MAGNESIUM HYDROXIDE 400 MG/5ML PO SUSP: 30.0000 mL | Freq: Every day | ORAL | Status: DC | PRN

## 2016-09-22 MED ORDER — SODIUM CHLORIDE 0.9 % IV SOLN: 250.0000 mL | INTRAVENOUS | Status: DC | PRN

## 2016-09-22 MED ORDER — SODIUM CHLORIDE 0.9% FLUSH
3.0000 mL | Freq: Two times a day (BID) | INTRAVENOUS | Status: DC
  Administered 2016-09-22 – 2016-09-23 (×3): 3 mL via INTRAVENOUS

## 2016-09-22 NOTE — Care Management Note (Signed)
Case Management Note  Patient Details  Name: [name] Green MRN: [id number] Date of Birth: 01-09-1946  Subjective/Objective:    From home, pta indep, S/p CABG, and MVR, extubated today, conts with chest tubes.. He has three daughters that are able to assist him at home, he states they will all take shifts to be with him after discharge.  He has medication coverage and a PCP  9/10 1107 Letha Capeeborah Oracio Galen RN, BSN-  For transfer to SDU, still in junctional with pvc's, dc external pacer, conts on asa, resume plavix and restart eliquis at  Discharge.                Action/Plan:  NCM will follow for dc needs.   Expected Discharge Date:                  Expected Discharge Plan:  Home/Self Care  In-House Referral:     Discharge planning Services  CM Consult  Post Acute Care Choice:    Choice offered to:     DME Arranged:    DME Agency:     HH Arranged:    HH Agency:     Status of Service:  In process, will continue to follow  If discussed at Long Length of Stay Meetings, dates discussed:    Additional Comments:  [name], Talen Poser [place], RN 09/22/2016, 11:07 AM

## 2016-09-22 NOTE — Progress Notes (Signed)
5 Days Post-Op Procedure(s) (LRB): CORONARY ARTERY BYPASS GRAFTING (CABG), ON PUMP, TIMES THREE, USING LEFT INTERNAL MAMMARY ARTERY AND ENDOSCOPICALLY HARVESTED LEFT GREATER SAPHENOUS VEIN (N/A) MITRAL VALVE REPAIR (MVR) (N/A) MAZE (N/A) TRANSESOPHAGEAL ECHOCARDIOGRAM (TEE) (N/A) Subjective: "Can I go home?"  Objective: Vital signs in last 24 hours: Temp:  [97.4 F (36.3 C)-98.9 F (37.2 C)] 97.4 F (36.3 C) (09/10 0317) Pulse Rate:  [38-83] 72 (09/10 0700) Cardiac Rhythm: Normal sinus rhythm (09/10 0317) Resp:  [11-23] 13 (09/10 0700) BP: (93-147)/(51-93) 121/62 (09/10 0700) SpO2:  [93 %-100 %] 100 % (09/10 0700) Weight:  [211 lb 4.8 oz (95.8 kg)] 211 lb 4.8 oz (95.8 kg) (09/10 0600)  Hemodynamic parameters for last 24 hours:    Intake/Output from previous day: 09/09 0701 - 09/10 0700 In: 1080 [P.O.:1080] Out: 1450 [Urine:1450] Intake/Output this shift: No intake/output data recorded.  General appearance: alert, cooperative and no distress Neurologic: intact Heart: slightly irregular Lungs: diminished breath sounds bibasilar Extremities: no edema Wound: cleana nd dry  Lab Results:  Recent Labs  09/20/16 0316 09/21/16 0454  WBC 6.6 4.9  HGB 9.0* 9.0*  HCT 28.2* 28.3*  PLT 63* 69*   BMET:  Recent Labs  09/20/16 0316 09/21/16 0454  NA 132* 134*  K 4.6 4.4  CL 103 104  CO2 24 24  GLUCOSE 101* 110*  BUN 37* 37*  CREATININE 1.33* 1.24  CALCIUM 8.4* 8.5*    PT/INR: No results for input(s): LABPROT, INR in the last 72 hours. ABG    Component Value Date/Time   PHART 7.365 09/19/2016 0548   HCO3 22.3 09/19/2016 0548   TCO2 24 09/19/2016 0548   ACIDBASEDEF 3.0 (H) 09/19/2016 0548   O2SAT 91.0 09/19/2016 0548   CBG (last 3)   Recent Labs  09/21/16 1109 09/21/16 1511 09/21/16 2105  GLUCAP 121* 132* 107*    Assessment/Plan: S/P Procedure(s) (LRB): CORONARY ARTERY BYPASS GRAFTING (CABG), ON PUMP, TIMES THREE, USING LEFT INTERNAL MAMMARY ARTERY  AND ENDOSCOPICALLY HARVESTED LEFT GREATER SAPHENOUS VEIN (N/A) MITRAL VALVE REPAIR (MVR) (N/A) MAZE (N/A) TRANSESOPHAGEAL ECHOCARDIOGRAM (TEE) (N/A) Plan for transfer to step-down: see transfer orders  Still awaiting tele bed CV- still in junctional with PVCs- will dc external pacer  On ASA, will resume plavix  Restart Eliquis at dc  RESP- continue IS  RENAL- no labs today- still above preop weight - continue lasix, K  ENDO- CBG well controlled  GI- tolerating POs  Continue cardiac rehab   LOS: 5 days    [name] Green 09/22/2016

## 2016-09-23 ENCOUNTER — Inpatient Hospital Stay (HOSPITAL_COMMUNITY): Payer: Medicare Other

## 2016-09-23 ENCOUNTER — Other Ambulatory Visit: Payer: Self-pay

## 2016-09-23 LAB — CBC
HCT: 28.5 % — ABNORMAL LOW (ref 39.0–52.0)
Hemoglobin: 9 g/dL — ABNORMAL LOW (ref 13.0–17.0)
MCH: 26 pg (ref 26.0–34.0)
MCHC: 31.6 g/dL (ref 30.0–36.0)
MCV: 82.4 fL (ref 78.0–100.0)
PLATELETS: 124 10*3/uL — AB (ref 150–400)
RBC: 3.46 MIL/uL — ABNORMAL LOW (ref 4.22–5.81)
RDW: 15.6 % — AB (ref 11.5–15.5)
WBC: 6.5 10*3/uL (ref 4.0–10.5)

## 2016-09-23 LAB — GLUCOSE, CAPILLARY
GLUCOSE-CAPILLARY: 108 mg/dL — AB (ref 65–99)
Glucose-Capillary: 116 mg/dL — ABNORMAL HIGH (ref 65–99)
Glucose-Capillary: 106 mg/dL — ABNORMAL HIGH (ref 65–99)
Glucose-Capillary: 160 mg/dL — ABNORMAL HIGH (ref 65–99)

## 2016-09-23 LAB — BASIC METABOLIC PANEL
ANION GAP: 6 (ref 5–15)
BUN: 27 mg/dL — ABNORMAL HIGH (ref 6–20)
CALCIUM: 8.5 mg/dL — AB (ref 8.9–10.3)
CO2: 26 mmol/L (ref 22–32)
CREATININE: 1.14 mg/dL (ref 0.61–1.24)
Chloride: 100 mmol/L — ABNORMAL LOW (ref 101–111)
GFR calc Af Amer: >60 mL/min (ref >60)
GLUCOSE: 129 mg/dL — AB (ref 65–99)
Potassium: 3.8 mmol/L (ref 3.5–5.1)
Sodium: 132 mmol/L — ABNORMAL LOW (ref 135–145)

## 2016-09-23 MED ORDER — POTASSIUM CHLORIDE CRYS ER 20 MEQ PO TBCR
20.0000 meq | EXTENDED_RELEASE_TABLET | Freq: Two times a day (BID) | ORAL | Status: DC
  Administered 2016-09-23 – 2016-09-24 (×3): 20 meq via ORAL
  Filled 2016-09-23 (×3): qty 1

## 2016-09-23 NOTE — Progress Notes (Signed)
CARDIAC REHAB PHASE I   PRE:  Rate/Rhythm: 9481 JR with PVCs and then bursts of afib to 129    BP: sitting 132/72    SaO2: 97 RA  MODE:  Ambulation: 470 ft   POST:  Rate/Rhythm: 91 JR with PVCs    BP: sitting 158/81     SaO2: 98 RA  Pt able to stand and walk without AD. Light min assist at arm. Fatigue/SOB and rested x3. His rhythm changed to afib sitting in recliner x2, rate 120s. Also had bouts while walking. Pt asx.  Return to recliner. Encouraged more walking. [id number]  Harriet MassonRandi Kristan Bladen Umar CES, ACSM 09/23/2016 11:16 AM

## 2016-09-23 NOTE — Progress Notes (Addendum)
      [address]       [place] [id number]             [phone]      6 Days Post-Op Procedure(s) (LRB): CORONARY ARTERY BYPASS GRAFTING (CABG), ON PUMP, TIMES THREE, USING LEFT INTERNAL MAMMARY ARTERY AND ENDOSCOPICALLY HARVESTED LEFT GREATER SAPHENOUS VEIN (N/A) MITRAL VALVE REPAIR (MVR) (N/A) MAZE (N/A) TRANSESOPHAGEAL ECHOCARDIOGRAM (TEE) (N/A) Subjective: No issues, doesn't like the food.   Objective: Vital signs in last 24 hours: Temp:  [97.8 F (36.6 C)-98.7 F (37.1 C)] 98.6 F (37 C) (09/11 0333) Pulse Rate:  [82-145] 84 (09/10 1816) Cardiac Rhythm: Atrial fibrillation (09/11 0700) Resp:  [15-21] 17 (09/11 0333) BP: (124-145)/(55-72) 143/57 (09/11 0333) SpO2:  [92 %-98 %] 98 % (09/11 0333) Weight:  [99.3 kg (219 lb)] 99.3 kg (219 lb) (09/11 0333)     Intake/Output from previous day: 09/10 0701 - 09/11 0700 In: 1500 [P.O.:1500] Out: 950 [Urine:950] Intake/Output this shift: No intake/output data recorded.  General appearance: alert, cooperative and no distress Heart: irregular Lungs: clear to auscultation bilaterally Abdomen: soft, non-tender; bowel sounds normal; no masses,  no organomegaly Extremities: 1+ pedal edema Wound: left EVH site drainage clear. sternal incision c/d/i  Lab Results:  Recent Labs  09/22/16 1835 09/23/16 0257  WBC 6.4 6.5  HGB 8.9* 9.0*  HCT 28.1* 28.5*  PLT 124* 124*   BMET:  Recent Labs  09/22/16 1835 09/23/16 0257  NA 131* 132*  K 4.1 3.8  CL 100* 100*  CO2 24 26  GLUCOSE 126* 129*  BUN 28* 27*  CREATININE 1.13 1.14  CALCIUM 8.5* 8.5*    PT/INR: No results for input(s): LABPROT, INR in the last 72 hours. ABG    Component Value Date/Time   PHART 7.365 09/19/2016 0548   HCO3 22.3 09/19/2016 0548   TCO2 24 09/19/2016 0548   ACIDBASEDEF 3.0 (H) 09/19/2016 0548   O2SAT 91.0 09/19/2016 0548   CBG (last 3)   Recent Labs  09/22/16 1528 09/22/16 2120 09/23/16 0637  GLUCAP 122* 144*  116*    Assessment/Plan: S/P Procedure(s) (LRB): CORONARY ARTERY BYPASS GRAFTING (CABG), ON PUMP, TIMES THREE, USING LEFT INTERNAL MAMMARY ARTERY AND ENDOSCOPICALLY HARVESTED LEFT GREATER SAPHENOUS VEIN (N/A) MITRAL VALVE REPAIR (MVR) (N/A) MAZE (N/A) TRANSESOPHAGEAL ECHOCARDIOGRAM (TEE) (N/A)  1. CV-junctional, frequent PVCs, rate 80s-90s. Will order EKG. On plavix and Eliquis.  2. Pulm-CXR with slight improvement, persistent bibasilar atelectasis or pneumonia with small bilateral pleural effusions.  3. Renal-creatinine 1.14, stable. Replace potassium.  4. H and H stable, platelets stable 5. Endo-blood glucose level well controlled.   Plan: Ambulation. EKG, keep wires one more day. Encourage incentive spirometry. Work on increasing oral intake.    LOS: 6 days    Sharlene Doryessa N Conte 09/23/2016 Patient seen and examined He has been in a junctional rhythm with PVCs for several days now. Not using external pacer- dc wires Small bilateral pleural effusions- continue lasix, increase K to BID Hold Eliquis today for wire removal Home tomorrow if no issues

## 2016-09-23 NOTE — Progress Notes (Signed)
Pt. Ambulated down the hallway and back. HR 140s. No complaints of pain or shortness of breath

## 2016-09-23 NOTE — Progress Notes (Signed)
Called PA Tessa to make aware that morning dose of eliquis was given.  [name] Green said to hold off pulling the pacing wires today. Pt made aware.  Pt resting with call bell within reach.  Will continue to monitor. Breck HoffBurton, [place], RN

## 2016-09-23 NOTE — Discharge Summary (Signed)
Physician Discharge Summary  Patient ID: [name] Green MRN: [id number] DOB/AGE: 08/04/1945 71 y.o.  Admit date: 09/17/2016 Discharge date: 09/24/2016  Admission Diagnoses:  Patient Active Problem List   Diagnosis Date Noted  . Atrial fibrillation, persistent (HCC) 08/14/2016  . Chronic systolic CHF (congestive heart failure), NYHA class 2 (HCC) 08/14/2016  . HTN (hypertension) 08/14/2016  . Hyperlipidemia   . PAD (peripheral artery disease) (HCC)   . Cardiomyopathy (HCC)   . CAD, multiple vessel   . Mitral regurgitation 07/30/2016   Discharge Diagnoses:   Patient Active Problem List   Diagnosis Date Noted  . S/P CABG x 3 09/17/2016  . Atrial fibrillation, persistent (HCC) 08/14/2016  . Chronic systolic CHF (congestive heart failure), NYHA class 2 (HCC) 08/14/2016  . HTN (hypertension) 08/14/2016  . Hyperlipidemia   . PAD (peripheral artery disease) (HCC)   . Cardiomyopathy (HCC)   . CAD, multiple vessel   . Mitral regurgitation 07/30/2016   Discharged Condition: good  History of Present Illness:  [name] is a 71 yo male with known history of Hypertension, Hyperlipidemia, and tobacco abuse.  He also has known PAD with stent placement in his left leg.  He was in his usual state of health until earlier this year.  At that point he noticed he was becoming more short of breath with exertion.  He states walking to he mailbox would make him feel like he ran a race.  In May he presented to [facility] with severe shortness of breath.  He was noted to be in atrial flutter with a rapid ventricular response.  His troponin was mildly elevated.  He was treated with Lasix and Metoprolol but left AMA.  He presented to Dr. Rhona Leavenshiu for further follow up.  At that time he was started on Eliquis and his lopressor dose was further increased.  He had some improvement of symptoms, but they still persisted.  He had an Echocardiogram in July which showed moderate to severe mitral regurgitation with  thickened mitral valve leaflets, reduced EF Of 30%.  He also underwent cardiac catheterization which showed multivessel CAD.  He was offered treatment in [place], but the patient wanted a second opinion and presented to [facility] for evaluation.  He was evaluated by Dr. Dorris FetchHendrickson who felt the patient would benefit from coronary bypass grafting procedure, MAZE procedure, and Mitral Valve Repair.  The risks and benefits of the procedure were explained to the patient and he was agreeable to proceed.      Hospital Course:   [name] presented to [facility] on 09[facility]2016.  He went to the operating room and underwent CABG x 3 utilizing LIMA to LAD, SVG to Diagonal 2, and SVG to PDA.  He underwent mitral valve repair with 30 mm Physio II Annuloplasty ring, and left/right modified Cox Maze 3 using radiofrequency and cryoablation.  He also underwent endoscopic harvest of greater saphenous vein from right leg.  He tolerated the procedure without difficulty.  He was extubated the evening of surgery.  During his stay in the SICU his chest tubes and arterial lines were removed without difficulty.  He was weaned off Dopamine as tolerated.  He remained bradycardic under his pacemaker.  His lopressor and Amiodarone was discontinued.  After administration of lovenox his platelet count decreased.  This was discontinued and HIT panel was obtained, which was ultimately negative.  He was restarted on Eliquis for his previous Atrial Fibrillation.  He developed mild elevation of his creatinine.  Once  improved he was treated with Lasix for edema. His heart rhythm converted to junctional rhythm with occasional PVCs.  He was ambulating and felt medically stable for transfer to the telemetry unit on POD #5.  He remained clinically stable.  He continued to be in junctional rhythm which he is tolerating without difficulty.  His temporary pacing wires were removed without difficulty.  He was ambulating independently.  He was  tolerating a heart healthy diet.  His pain is well controlled.  He is felt medically stable for discharge home today.  Treatments: surgery:   Median sternotomy, extracorporeal circulation, coronary artery bypass grafting x3 (left internal mammary artery to LAD, saphenous vein graft to second diagonal, saphenous vein graft to posterior descending), and endoscopic vein harvest of right leg, mitral valve repair with 30-mm physio II annuloplasty ring, left and right modified Cox Maze 3 using radiofrequency ablation and cryoablation.  Disposition: Home  Discharge Medications:  The patient has been discharged on:   1.Beta Blocker:  Yes [   ]                              No   [  x ]                              If No, reason: junctional bradycardia  2.Ace Inhibitor/ARB: Yes [   ]                                     No  [ x   ]                                     If No, reason: labile BP  3.Statin:   Yes [ x  ]                  No  [   ]                  If No, reason:  4.Ecasa:  Yes  [ x  ]                  No   [   ]                  If No, reason:    Allergies as of 09/24/2016   No Known Allergies     Medication List    STOP taking these medications   amLODipine 5 MG tablet Commonly known as:  NORVASC   ibuprofen 200 MG tablet Commonly known as:  ADVIL,MOTRIN   lisinopril 10 MG tablet Commonly known as:  PRINIVIL,ZESTRIL   metoprolol succinate 50 MG 24 hr tablet Commonly known as:  TOPROL-XL     TAKE these medications   aspirin EC 81 MG tablet Take 81 mg by mouth daily.   atorvastatin 40 MG tablet Commonly known as:  LIPITOR Take 40 mg by mouth daily.   clopidogrel 75 MG tablet Commonly known as:  PLAVIX Take 75 mg by mouth daily.   ELIQUIS 5 MG Tabs tablet Generic drug:  apixaban Take 5 mg by mouth 2 (two) times daily.   furosemide 40 MG tablet Commonly known as:  LASIX  Take 1 tablet (40 mg total) by mouth daily.   KRILL OIL PO Take 500 mg by mouth  daily.   potassium chloride SA 20 MEQ tablet Commonly known as:  K-DUR,KLOR-CON Take 1 tablet (20 mEq total) by mouth 2 (two) times daily.   traMADol 50 MG tablet Commonly known as:  ULTRAM Take 1-2 tablets (50-100 mg total) by mouth every 4 (four) hours as needed for moderate pain.            Discharge Care Instructions        Start     Ordered   09/24/16 0000  furosemide (LASIX) 40 MG tablet  Daily    Question:  Supervising Provider  Answer:  Loreli Slot   09/24/16 4098   09/24/16 0000  potassium chloride SA (K-DUR,KLOR-CON) 20 MEQ tablet  2 times daily    Question:  Supervising Provider  Answer:  Loreli Slot   09/24/16 0819   09/24/16 0000  traMADol (ULTRAM) 50 MG tablet  Every 4 hours PRN    Question:  Supervising Provider  Answer:  Loreli Slot   09/24/16 1191     Follow-up Information    Loreli Slot, MD Follow up on 10/21/2016.   Specialty:  Cardiothoracic Surgery Why:  Appointment is at 9:45, please get CXR at 9:15 at [facility] located on first floor of our office building Contact information: [address] [place] [address] [phone]        Caryl Ada, MD. Schedule an appointment as soon as possible for a visit.   Specialty:  Cardiology Why:  Please contact at discharge to set up 2 week follow up from hospital discharge  Contact information: [address] [place] [address] [phone]           Signed: Lowella Dandy 09/24/2016, 8:20 AM

## 2016-09-23 NOTE — Care Management Important Message (Signed)
Important Message  Patient Details  Name: [name] Green MRN: [id number] Date of Birth: 09/25/1945   Medicare Important Message Given:  Yes    [name], [name] 09/23/2016, 9:41 AM

## 2016-09-23 NOTE — Progress Notes (Signed)
Patient had several bursts of afib throughout shift up to 140's and then returned to 80-90's. Patient is asymptomatic. No sob or pain. Pt resting with call bell within reach.  Will continue to monitor. Tarance HoffBurton, Marsia Cino McClintock, RN

## 2016-09-24 LAB — GLUCOSE, CAPILLARY
GLUCOSE-CAPILLARY: 124 mg/dL — AB (ref 65–99)
Glucose-Capillary: 117 mg/dL — ABNORMAL HIGH (ref 65–99)

## 2016-09-24 MED ORDER — FUROSEMIDE 40 MG PO TABS: 40.0000 mg | ORAL_TABLET | Freq: Every day | ORAL | 0 refills | Status: DC

## 2016-09-24 MED ORDER — POTASSIUM CHLORIDE CRYS ER 20 MEQ PO TBCR: 20.0000 meq | EXTENDED_RELEASE_TABLET | Freq: Two times a day (BID) | ORAL | 0 refills | Status: DC

## 2016-09-24 MED ORDER — TRAMADOL HCL 50 MG PO TABS: 50.0000 mg | ORAL_TABLET | ORAL | 0 refills | Status: DC | PRN

## 2016-09-24 NOTE — Care Management Note (Signed)
Case Management Note Previous CM note initiated by [name] [name], [name] Clinton, RN--09/22/2016, 11:07 AM   Patient Details  Name: [name] [name] MRN: [id number] Date of Birth: March 22, 1945  Subjective/Objective:    From home, pta indep, S/p CABG, and MVR, extubated today, conts with chest tubes.. He has three daughters that are able to assist him at home, he states they will all take shifts to be with him after discharge.  He has medication coverage and a PCP  9/10 1107 [name] Capeeborah Taylor RN, BSN-  For transfer to SDU, still in junctional with pvc's, dc external pacer, conts on asa, resume plavix and restart eliquis at  Discharge.                Action/Plan:  NCM will follow for dc needs.   Expected Discharge Date:  09/24/16               Expected Discharge Plan:  Home/Self Care  In-House Referral:  NA  Discharge planning Services  CM Consult  Post Acute Care Choice:  NA Choice offered to:  NA  DME Arranged:    DME Agency:     HH Arranged:    HH Agency:     Status of Service:  Completed, signed off  If discussed at Long Length of Stay Meetings, dates discussed:    Discharge Disposition: home/self care   Additional Comments:  09/24/16- 1000- [name] Moose RN, CM- pt for d/c home today- no CM needs noted for discharge.   [name] [name], [name] Hall, RN 09/24/2016, 10:12 AM [phone]

## 2016-09-24 NOTE — Progress Notes (Addendum)
      [address]       [place] [id number]             [phone]      7 Days Post-Op Procedure(s) (LRB): CORONARY ARTERY BYPASS GRAFTING (CABG), ON PUMP, TIMES THREE, USING LEFT INTERNAL MAMMARY ARTERY AND ENDOSCOPICALLY HARVESTED LEFT GREATER SAPHENOUS VEIN (N/A) MITRAL VALVE REPAIR (MVR) (N/A) MAZE (N/A) TRANSESOPHAGEAL ECHOCARDIOGRAM (TEE) (N/A)   Subjective:  [name] [name] has no complaints.  He is ambulating without difficulty  + BM  Objective: Vital signs in last 24 hours: Temp:  [98.4 F (36.9 C)-98.7 F (37.1 C)] 98.7 F (37.1 C) (09/12 0458) Pulse Rate:  [74-88] 88 (09/12 0458) Cardiac Rhythm: Atrial fibrillation;Bundle branch block (09/11 1900) Resp:  [14-18] 14 (09/12 0715) BP: (104-135)/(70-91) 135/75 (09/12 0715) SpO2:  [97 %-100 %] 100 % (09/12 0458) Weight:  [219 lb (99.3 kg)] 219 lb (99.3 kg) (09/12 0458)  Intake/Output from previous day: 09/11 0701 - 09/12 0700 In: 960 [P.O.:960] Out: 700 [Urine:700]  General appearance: alert, cooperative and no distress Heart: irregularly irregular rhythm Lungs: clear to auscultation bilaterally Abdomen: soft, non-tender; bowel sounds normal; no masses,  no organomegaly Extremities: edema tracec Wound: clean and dry  Lab Results:  Recent Labs  09/22/16 1835 09/23/16 0257  WBC 6.4 6.5  HGB 8.9* 9.0*  HCT 28.1* 28.5*  PLT 124* 124*   BMET:  Recent Labs  09/22/16 1835 09/23/16 0257  NA 131* 132*  K 4.1 3.8  CL 100* 100*  CO2 24 26  GLUCOSE 126* 129*  BUN 28* 27*  CREATININE 1.13 1.14  CALCIUM 8.5* 8.5*    PT/INR: No results for input(s): LABPROT, INR in the last 72 hours. ABG    Component Value Date/Time   PHART 7.365 09/19/2016 0548   HCO3 22.3 09/19/2016 0548   TCO2 24 09/19/2016 0548   ACIDBASEDEF 3.0 (H) 09/19/2016 0548   O2SAT 91.0 09/19/2016 0548   CBG (last 3)   Recent Labs  09/23/16 1657 09/23/16 2052 09/24/16 0626  GLUCAP 108* 160* 117*     Assessment/Plan: S/P Procedure(s) (LRB): CORONARY ARTERY BYPASS GRAFTING (CABG), ON PUMP, TIMES THREE, USING LEFT INTERNAL MAMMARY ARTERY AND ENDOSCOPICALLY HARVESTED LEFT GREATER SAPHENOUS VEIN (N/A) MITRAL VALVE REPAIR (MVR) (N/A) MAZE (N/A) TRANSESOPHAGEAL ECHOCARDIOGRAM (TEE) (N/A)  1. CV-rate controlled A. Fib- wires need to be removed, will hold AM dose of Eliquis 2. Pulm- no acute issue, continue IS 3. Renal- creatinine stable, continue Lasix, potassium supplementation 4. Dispo- patient stable, will plan to d/c home today after wire removal   LOS: 7 days    [name], [name] 09/24/2016 Patient seen and examined, agree with above Home later today. Should restart Eliquis tomorrow  Viviann SpareSteven C. Dorris FetchHendrickson, MD [facility] [phone]

## 2016-09-24 NOTE — Progress Notes (Signed)
[phone] Education completed with pt who voiced understanding. Encouraged him to have daughters read ed. Reviewed importance of IS,sternal precautions, ex ed. Encouraged pt to watch carbs and sodium with A1C of 6.1 and low EF.  Gave heart healthy and low sodium diets. Encouraged him to weigh daily and notify MD if he gains 3 lbs overnight or 5 pounds in a week. Discussed CRP 2 and will send a letter of [facility] to [facility] . Cannot write order per Dr Rhona Leavenshiu. Offered to show discharge video but he declined.  Luetta NuttingCharlene Kamyah Wilhelmsen RN BSN 09/24/2016 10:16 AM

## 2016-09-24 NOTE — Progress Notes (Signed)
Patient education completed for removal of pacer wires, he verbalized understanding, 6 pacerwires removed as ordered, was well tolerated, patient on bedrest for one hour will continue to monitor.

## 2016-09-24 NOTE — Progress Notes (Signed)
Patient in a stable condition, discharge education reviewed with patient and his family at bedside, they verbalized understanding, paper prescriptions given to patient, patient belongings at bedside, iv removed, tele dc ccmd notified, patient;s family to transport patient home.

## 2016-09-25 ENCOUNTER — Other Ambulatory Visit: Payer: Self-pay | Admitting: *Deleted

## 2016-09-25 DIAGNOSIS — R600 Localized edema: Secondary | ICD-10-CM

## 2016-09-25 MED ORDER — FUROSEMIDE 40 MG PO TABS: 40.0000 mg | ORAL_TABLET | Freq: Every day | ORAL | 0 refills | Status: DC

## 2016-09-29 ENCOUNTER — Other Ambulatory Visit: Payer: Self-pay | Admitting: *Deleted

## 2016-09-29 DIAGNOSIS — R0602 Shortness of breath: Secondary | ICD-10-CM

## 2016-09-30 ENCOUNTER — Encounter: Payer: Self-pay | Admitting: Thoracic Surgery (Cardiothoracic Vascular Surgery)

## 2016-09-30 ENCOUNTER — Ambulatory Visit: Payer: Medicare Other | Admitting: Thoracic Surgery (Cardiothoracic Vascular Surgery)

## 2016-09-30 ENCOUNTER — Ambulatory Visit (INDEPENDENT_AMBULATORY_CARE_PROVIDER_SITE_OTHER): Payer: Self-pay | Admitting: Thoracic Surgery (Cardiothoracic Vascular Surgery)

## 2016-09-30 ENCOUNTER — Ambulatory Visit
Admission: RE | Admit: 2016-09-30 | Discharge: 2016-09-30 | Disposition: A | Discharge status: Home / Self Care | Payer: Medicare Other | Source: Ambulatory Visit | Attending: Thoracic Surgery (Cardiothoracic Vascular Surgery) | Admitting: Thoracic Surgery (Cardiothoracic Vascular Surgery)

## 2016-09-30 VITALS — BP 115/76 | HR 98 | Resp 16 | Ht 73.0 in | Wt 210.0 lb

## 2016-09-30 DIAGNOSIS — Z8679 Personal history of other diseases of the circulatory system: Secondary | ICD-10-CM

## 2016-09-30 DIAGNOSIS — I481 Persistent atrial fibrillation: Secondary | ICD-10-CM

## 2016-09-30 DIAGNOSIS — I2510 Atherosclerotic heart disease of native coronary artery without angina pectoris: Secondary | ICD-10-CM

## 2016-09-30 DIAGNOSIS — R600 Localized edema: Secondary | ICD-10-CM

## 2016-09-30 DIAGNOSIS — R0602 Shortness of breath: Secondary | ICD-10-CM

## 2016-09-30 DIAGNOSIS — Z951 Presence of aortocoronary bypass graft: Secondary | ICD-10-CM

## 2016-09-30 DIAGNOSIS — I340 Nonrheumatic mitral (valve) insufficiency: Secondary | ICD-10-CM

## 2016-09-30 DIAGNOSIS — I4819 Other persistent atrial fibrillation: Secondary | ICD-10-CM

## 2016-09-30 DIAGNOSIS — Z9889 Other specified postprocedural states: Secondary | ICD-10-CM

## 2016-09-30 MED ORDER — PREDNISONE 10 MG (21) PO TBPK: ORAL_TABLET | ORAL | 0 refills | Status: DC

## 2016-09-30 MED ORDER — FUROSEMIDE 40 MG PO TABS: 40.0000 mg | ORAL_TABLET | Freq: Two times a day (BID) | ORAL | 2 refills | Status: DC

## 2016-09-30 NOTE — Progress Notes (Signed)
[address]       [name] [address]             [phone]     HPI: [name] returns for an unscheduled office visit with a complaint of shortness of breath.  [name] is a 71 year old gentleman who underwent coronary bypass grafting, mitral valve repair, and a Maze procedure on 09/17/2016. He stayed in the ICU for several days due to the need for neck sternal pacemaker. However he went into a stable junctional rhythm with some PVCs. He was discharged on postoperative day #7.  He's been home about a week. He says that he's been trying to walk 3 times a day for 5 minutes at a time, but started getting short of breath after only 2 or 3 minutes. He also is noted some swelling in his legs. His daughter says that he was having some wheezing over the weekend.  Past Medical History:  Diagnosis Date  . A-fib (HCC)    persistent  . CAD, multiple vessel   . Cardiomyopathy (HCC)   . Dysrhythmia    afib  . Hyperlipidemia   . Hypertension   . Mitral regurgitation 07/30/2016   ECHO  . PAD (peripheral artery disease) (HCC)    FOLLOWED BY DR. Sondra Come, ON PLAVIX  . S/P cardiac cath 07/30/2016     Current Outpatient Prescriptions  Medication Sig Dispense Refill  . apixaban (ELIQUIS) 5 MG TABS tablet Take 5 mg by mouth 2 (two) times daily.    Marland Kitchen aspirin EC 81 MG tablet Take 81 mg by mouth daily.    Marland Kitchen atorvastatin (LIPITOR) 40 MG tablet Take 40 mg by mouth daily.    . clopidogrel (PLAVIX) 75 MG tablet Take 75 mg by mouth daily.    . furosemide (LASIX) 40 MG tablet Take 1 tablet (40 mg total) by mouth 2 (two) times daily. 60 tablet 2  . KRILL OIL PO Take 500 mg by mouth daily.     . potassium chloride SA (K-DUR,KLOR-CON) 20 MEQ tablet Take 1 tablet (20 mEq total) by mouth 2 (two) times daily. 14 tablet 0  . traMADol (ULTRAM) 50 MG tablet Take 1-2 tablets (50-100 mg total) by mouth every 4 (four) hours as needed for moderate pain. 30 tablet 0  . predniSONE (STERAPRED  UNI-PAK 21 TAB) 10 MG (21) TBPK tablet Take 6 tablets on day 1, 5 tabs on day 2, 4 tabs on day 3, 3 tabs on day 4, 2 tabs on day 5, 1 tab on day 6 21 tablet 0   No current facility-administered medications for this visit.     Physical Exam BP 115/76 (BP Location: Right Arm, Patient Position: Sitting, Cuff Size: Large)   Pulse 98   Resp 16   Ht  (1.854 m)   Wt 210 lb (95.3 kg)   SpO2 96% Comment: ON RA  BMI 27.61 kg/m  71 year old man in no acute distress Alert and oriented 3 with no focal deficits Lungs diminished at both bases, otherwise clear, no wheezing Cardiac regular rate and rhythm no murmur Extremities 1+ edema Ecchymosis left thigh Incisions healing well  Diagnostic Tests: CHEST  2 VIEW  COMPARISON:  Chest x-ray of 09/23/2016  FINDINGS: The small effusions remain although perhaps slightly diminished in volume. Mild bibasilar atelectasis is present as well. Mediastinal and hilar contours are unchanged and the heart is mildly enlarged and stable. Median sternotomy sutures are noted from prior CABG and  mitral valve replacement. No bony abnormality is seen.  IMPRESSION: 1. Persistent small bilateral pleural effusions with bibasilar linear atelectasis. 2. Stable mild cardiomegaly.   Electronically Signed   By: Dwyane Dee M.D.   On: 09/30/2016 11:41 I personally reviewed the chest x-ray and concur with the findings noted above  Impression: 71 year old gentleman who is now about 2 weeks out from coronary bypass grafting, mitral valve repair, and Maze procedure. He complains of shortness of breath with exertion. He does appear volume overloaded. He is on Lasix 40 mg daily. I think we need to increase that to twice a day. He is already taking 20 mEq of potassium twice a day, so don't think we need to adjust that.  He was advised to carefully monitor his sodium intake and keep it less than 4 g a day. He has not been doing that  I suspect this is mostly  just hormonal mediated postoperative fluid retention. However there could be a component of post pericardiotomy syndrome in play. I'm going to give him a prednisone taper which should take care of that if that is an issue.  Plan: Prednisone 60 mg tapering to 10 mg over 6 days Lasix 40 mg twice a day He has no follow-up appointment scheduled with Dr. Red Christians in the next week or 2. I will see him back in 2 weeks to check on his progress  Loreli Slot, MD [facility] [phone]

## 2016-10-02 DIAGNOSIS — Z9889 Other specified postprocedural states: Secondary | ICD-10-CM

## 2016-10-02 DIAGNOSIS — Z8679 Personal history of other diseases of the circulatory system: Secondary | ICD-10-CM | POA: Insufficient documentation

## 2016-10-03 ENCOUNTER — Other Ambulatory Visit: Payer: Self-pay

## 2016-10-03 DIAGNOSIS — R609 Edema, unspecified: Secondary | ICD-10-CM

## 2016-10-03 MED ORDER — POTASSIUM CHLORIDE CRYS ER 20 MEQ PO TBCR: 20.0000 meq | EXTENDED_RELEASE_TABLET | Freq: Every day | ORAL | 0 refills | Status: DC

## 2016-10-20 ENCOUNTER — Other Ambulatory Visit: Payer: Self-pay | Admitting: Thoracic Surgery (Cardiothoracic Vascular Surgery)

## 2016-10-20 DIAGNOSIS — Z951 Presence of aortocoronary bypass graft: Secondary | ICD-10-CM

## 2016-10-21 ENCOUNTER — Ambulatory Visit (INDEPENDENT_AMBULATORY_CARE_PROVIDER_SITE_OTHER): Payer: Self-pay | Admitting: Thoracic Surgery (Cardiothoracic Vascular Surgery)

## 2016-10-21 ENCOUNTER — Encounter: Payer: Self-pay | Admitting: Thoracic Surgery (Cardiothoracic Vascular Surgery)

## 2016-10-21 ENCOUNTER — Ambulatory Visit
Admission: RE | Admit: 2016-10-21 | Discharge: 2016-10-21 | Disposition: A | Discharge status: Home / Self Care | Payer: Medicare Other | Source: Ambulatory Visit | Attending: Thoracic Surgery (Cardiothoracic Vascular Surgery) | Admitting: Thoracic Surgery (Cardiothoracic Vascular Surgery)

## 2016-10-21 VITALS — BP 111/72 | HR 83 | Resp 16 | Ht 73.0 in | Wt 195.4 lb

## 2016-10-21 DIAGNOSIS — I340 Nonrheumatic mitral (valve) insufficiency: Secondary | ICD-10-CM

## 2016-10-21 DIAGNOSIS — Z8679 Personal history of other diseases of the circulatory system: Secondary | ICD-10-CM

## 2016-10-21 DIAGNOSIS — Z951 Presence of aortocoronary bypass graft: Secondary | ICD-10-CM

## 2016-10-21 DIAGNOSIS — I481 Persistent atrial fibrillation: Secondary | ICD-10-CM

## 2016-10-21 DIAGNOSIS — I4819 Other persistent atrial fibrillation: Secondary | ICD-10-CM

## 2016-10-21 DIAGNOSIS — Z9889 Other specified postprocedural states: Secondary | ICD-10-CM

## 2016-10-21 DIAGNOSIS — I2510 Atherosclerotic heart disease of native coronary artery without angina pectoris: Secondary | ICD-10-CM

## 2016-10-21 NOTE — Progress Notes (Signed)
[address]       [place] [address]             [phone]     HPI: Mr. Newstrom returns for a scheduled postoperative follow-up visit  Mr. Mahler is a 71 year old man who originally presented with shortness of breath. He was found to be in rapid atrial fibrillation. An echocardiogram showed moderate to severe mitral regurgitation and ejection fraction 30%. Cardiac catheterization showed severe two-vessel coronary disease. He left [place] came to [place] for a second opinion.  He underwent coronary bypass grafting 3, mitral valve repair, and a Maze procedure on 09/17/2016. He required external pacing for several days postop, but eventually improved to a stable junctional rhythm. He went home on postop day 7.  I saw him in the office on 09/30/2016 about a week after he was discharged. He was complaining of shortness of breath and swelling in his legs. I started him on Lasix and also gave him a prednisone taper.  In the interim since his last visit he has improved dramatically. He has no swelling or shortness of breath. His exercise tolerance has improved significantly. He only has sternal discomfort if he coughs or sneezes. He has begun driving. He saw Dr. Red Christians last week and has another appointment in December.  Past Medical History:  Diagnosis Date  . A-fib (HCC)    persistent  . CAD, multiple vessel   . Cardiomyopathy (HCC)   . Dysrhythmia    afib  . Hyperlipidemia   . Hypertension   . Mitral regurgitation 07/30/2016   ECHO  . PAD (peripheral artery disease) (HCC)    FOLLOWED BY DR. Sondra Come, ON PLAVIX  . S/P cardiac cath 07/30/2016    Current Outpatient Prescriptions  Medication Sig Dispense Refill  . apixaban (ELIQUIS) 5 MG TABS tablet Take 5 mg by mouth 2 (two) times daily.    Marland Kitchen aspirin EC 81 MG tablet Take 81 mg by mouth daily.    Marland Kitchen atorvastatin (LIPITOR) 40 MG tablet Take 40 mg by mouth daily.    . clopidogrel (PLAVIX) 75 MG tablet Take 75 mg by  mouth daily.    Marland Kitchen KRILL OIL PO Take 500 mg by mouth daily.      No current facility-administered medications for this visit.     Physical Exam BP 111/72 (BP Location: Right Arm, Patient Position: Sitting, Cuff Size: Large)   Pulse 83   Resp 16   Ht  (1.854 m)   Wt 195 lb 6.4 oz (88.6 kg)   SpO2 96% Comment: ON RA  BMI 25.31 kg/m  71 year old man in no acute distress Alert and oriented 3 with no focal deficits Lungs clear with equal breath sounds bilaterally Cardiac regular rate and rhythm normal S1 and S2 no rubs or murmurs Sternum stable, incision well-healed Leg incisions well healed No peripheral edema  Diagnostic Tests: CHEST  2 VIEW  COMPARISON:  Two-view chest x-ray a 09/30/2016.  FINDINGS: Median sternotomy is again noted. CABG markers are present. Mitral valve annulus right knee is noted.  Bilateral pleural effusions and atelectasis continue to improve. Areas of linear atelectasis are present in the left lower lobe. No other significant airspace consolidation is present. The lungs are hyperinflated.  Mild degenerative changes of the thoracic spine are stable. A remote lumbar compression fracture is stable.  IMPRESSION: 1. Decreasing bilateral pleural effusions and associated atelectasis. 2. Status post median sternotomy for CABG and mitral valve replacement.  Electronically Signed   By: Marin Roberts M.D.   On: 10/21/2016 09:21 I reviewed this chest x-ray images and concur with the findings noted above  Rhythm strip shows a regular rhythm with a rate of 75 question junctional versus lower atrial pacemaker  Impression: Mr. Dearinger is a 71 year old gentleman who underwent coronary bypass grafting, mitral valve repair and maze procedure about a month ago. He is doing extremely well at this time with good exercise tolerance and minimal incisional discomfort.  His rhythm strip today showed no signs of atrial fibrillation. He will remain on  Eliquis for now. Hopefully that can be discontinued at some point in the future. I will defer that to Dr. Red Christians  He was cautioned not to lift anything over 10 pounds for another 2 weeks. After that he's unrestricted but should proceed cautiously. He may drive with appropriate precautions as well.  Plan: Follow-up with Dr. Red Christians.  I will be happy to see Mr. Holquin back any time the future if I can be of any further assistance with his care  Loreli Slot, MD [facility] [phone]

## 2016-10-22 ENCOUNTER — Other Ambulatory Visit: Payer: Self-pay | Admitting: Physician Assistant

## 2016-10-22 ENCOUNTER — Other Ambulatory Visit: Payer: Self-pay | Admitting: *Deleted

## 2016-10-22 DIAGNOSIS — G2581 Restless legs syndrome: Secondary | ICD-10-CM

## 2016-10-22 DIAGNOSIS — R600 Localized edema: Secondary | ICD-10-CM

## 2016-10-22 MED ORDER — ROPINIROLE HCL 0.25 MG PO TABS: 0.2500 mg | ORAL_TABLET | Freq: Every day | ORAL | 0 refills | Status: DC

## 2016-10-30 ENCOUNTER — Other Ambulatory Visit: Payer: Self-pay | Admitting: Thoracic Surgery (Cardiothoracic Vascular Surgery)

## 2016-10-30 DIAGNOSIS — R609 Edema, unspecified: Secondary | ICD-10-CM

## 2017-05-11 ENCOUNTER — Other Ambulatory Visit: Payer: Self-pay | Admitting: *Deleted

## 2017-05-11 DIAGNOSIS — J90 Pleural effusion, not elsewhere classified: Secondary | ICD-10-CM

## 2017-05-12 ENCOUNTER — Other Ambulatory Visit: Payer: Self-pay | Admitting: *Deleted

## 2017-05-12 ENCOUNTER — Ambulatory Visit: Payer: Medicare Other | Admitting: Thoracic Surgery (Cardiothoracic Vascular Surgery)

## 2017-05-12 ENCOUNTER — Ambulatory Visit
Admission: RE | Admit: 2017-05-12 | Discharge: 2017-05-12 | Disposition: A | Discharge status: Home / Self Care | Payer: Medicare Other | Source: Ambulatory Visit | Attending: Thoracic Surgery (Cardiothoracic Vascular Surgery) | Admitting: Thoracic Surgery (Cardiothoracic Vascular Surgery)

## 2017-05-12 ENCOUNTER — Other Ambulatory Visit: Payer: Self-pay

## 2017-05-12 ENCOUNTER — Encounter: Payer: Self-pay | Admitting: Thoracic Surgery (Cardiothoracic Vascular Surgery)

## 2017-05-12 VITALS — BP 141/78 | HR 81 | Resp 16 | Ht 73.0 in | Wt 201.2 lb

## 2017-05-12 DIAGNOSIS — Z9889 Other specified postprocedural states: Secondary | ICD-10-CM

## 2017-05-12 DIAGNOSIS — E785 Hyperlipidemia, unspecified: Secondary | ICD-10-CM

## 2017-05-12 DIAGNOSIS — I2510 Atherosclerotic heart disease of native coronary artery without angina pectoris: Secondary | ICD-10-CM

## 2017-05-12 DIAGNOSIS — J90 Pleural effusion, not elsewhere classified: Secondary | ICD-10-CM

## 2017-05-12 DIAGNOSIS — Z951 Presence of aortocoronary bypass graft: Secondary | ICD-10-CM

## 2017-05-12 DIAGNOSIS — Z8679 Personal history of other diseases of the circulatory system: Secondary | ICD-10-CM | POA: Diagnosis not present

## 2017-05-12 LAB — COMPREHENSIVE METABOLIC PANEL
AG RATIO: 1.4 (calc) (ref 1.0–2.5)
ALKALINE PHOSPHATASE (APISO): 197 U/L — AB (ref 40–115)
ALT: 26 U/L (ref 9–46)
AST: 31 U/L (ref 10–35)
Albumin: 3.4 g/dL — ABNORMAL LOW (ref 3.6–5.1)
BILIRUBIN TOTAL: 0.7 mg/dL (ref 0.2–1.2)
BUN: 19 mg/dL (ref 7–25)
CHLORIDE: 102 mmol/L (ref 98–110)
CO2: 32 mmol/L (ref 20–32)
Calcium: 8.7 mg/dL (ref 8.6–10.3)
Creat: 1.04 mg/dL (ref 0.70–1.18)
GLOBULIN: 2.5 g/dL (ref 1.9–3.7)
GLUCOSE: 81 mg/dL (ref 65–139)
Potassium: 3.4 mmol/L — ABNORMAL LOW (ref 3.5–5.3)
Sodium: 142 mmol/L (ref 135–146)
Total Protein: 5.9 g/dL — ABNORMAL LOW (ref 6.1–8.1)

## 2017-05-12 MED ORDER — POTASSIUM CHLORIDE CRYS ER 20 MEQ PO TBCR: 20.0000 meq | EXTENDED_RELEASE_TABLET | Freq: Two times a day (BID) | ORAL | 5 refills | Status: AC

## 2017-05-12 MED ORDER — FUROSEMIDE 40 MG PO TABS: 40.0000 mg | ORAL_TABLET | Freq: Two times a day (BID) | ORAL | 5 refills | Status: AC

## 2017-05-12 NOTE — Patient Instructions (Signed)
Take lasix 40 mg twice daily  Take potassium 20 mEq twice daily  Take prednisone as directed

## 2017-05-12 NOTE — Progress Notes (Signed)
[address]       [place] [address]             [phone]     HPI: [name] returns with a complaint of recurrent pleural effusions  [name] is a 72 year old man who presented in the summer 2018 with shortness of breath.  He had rapid atrial fibrillation.  An echocardiogram showed ejection fraction 30% and moderate to severe mitral regurgitation.  At catheterization he had severe two-vessel disease.  He underwent coronary bypass grafting x3, mitral valve repair, and a Maze procedure on 09/17/2016.  He went home on postoperative day #7.  I last saw him in the office in October.  His feeling well.  He was in a junctional rhythm.  He had good exercise tolerance.  His chest x-ray showed improving small bilateral pleural effusions.  Recently he has had problems with shortness of breath, leg swelling, and recurrent pleural effusions.  In the interim since his last visit he also had a pacemaker placed.  He has had multiple thoracenteses bilaterally.  He has had a total of 5 done there within our PACS system.  He gets immediate relief after the fluid is drained.  But his symptoms recurred relatively rapidly.  He has been given 20 mg of Lasix for short periods of time after his thoracenteses.  He currently is on 20 mg a day.  Past Medical History:  Diagnosis Date  . A-fib ([facility])    persistent  . CAD, multiple vessel   . Cardiomyopathy ([place])   . Dysrhythmia    afib  . Hyperlipidemia   . Hypertension   . Mitral regurgitation 07/30/2016   ECHO  . PAD (peripheral artery disease) ([facility])    FOLLOWED BY DR. Maryjean Morn, ON PLAVIX  . S/P cardiac cath 07/30/2016     Current Outpatient Medications  Medication Sig Dispense Refill  . amLODipine (NORVASC) 5 MG tablet Take 5 mg by mouth daily.    Marland Kitchen apixaban (ELIQUIS) 5 MG TABS tablet Take 5 mg by mouth daily.     Marland Kitchen atorvastatin (LIPITOR) 40 MG tablet Take 40 mg by mouth daily.    . clopidogrel (PLAVIX) 75 MG tablet Take 75 mg by  mouth daily.    Marland Kitchen KRILL OIL PO Take 500 mg by mouth daily.     Marland Kitchen lisinopril (PRINIVIL,ZESTRIL) 10 MG tablet Take 10 mg by mouth daily.    . metoprolol succinate (TOPROL-XL) 50 MG 24 hr tablet Take 50 mg by mouth daily. Take with or immediately following a meal.    . rOPINIRole (REQUIP) 0.25 MG tablet Take 1 tablet (0.25 mg total) by mouth at bedtime. X 2 NIGHTS, THEN INCREASE TO 2 TABLETS (0.'5mg'$  total) 62 tablet 0  . furosemide (LASIX) 40 MG tablet Take 1 tablet (40 mg total) by mouth 2 (two) times daily. 60 tablet 5  . potassium chloride SA (K-DUR,KLOR-CON) 20 MEQ tablet Take 1 tablet (20 mEq total) by mouth 2 (two) times daily. 60 tablet 5   No current facility-administered medications for this visit.     Physical Exam BP (!) 141/78 (BP Location: Right Arm, Patient Position: Sitting, Cuff Size: Normal)   Pulse 81   Resp 16   Ht '6\' 1"'$  (1.854 m)   Wt 201 lb 3.2 oz (91.3 kg)   SpO2 96% Comment: ON RA  BMI 26.48 kg/m  72 year old man in no acute distress Alert and oriented x3 with no focal deficits Cardiac regular  rate and rhythm no audible murmur Lungs diminished breath sounds both bases 2+ edema both lower extremities  Diagnostic Tests: CHEST - 2 VIEW  COMPARISON:  One-view chest x-ray 05/11/2017 at [facility] following thoracentesis.  FINDINGS: Heart size is normal. The pacing and defibrillator wires are stable. Median sternotomy for CABG and annuloplasty is noted. Right greater than left pleural effusions are stable. There is no pneumothorax. Visualized soft tissues and bony thorax are unremarkable.  IMPRESSION: 1. Stable small bilateral pleural effusions, right greater than left. 2. No pneumothorax.   Electronically Signed   By: San Morelle M.D.   On: 05/12/2017 10:10 I personally reviewed the chest x-ray images and concur with the findings noted above  Impression: 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  He had coronary bypass grafting x3, mitral valve repair, and a Maze procedure back in September.  This spring he has had problems with recurrent pleural effusions requiring multiple thoracenteses over the past month or so.  For the most part the effusions appear to be serous to slightly blood-tinged from the descriptions.  I have a limited laboratory data from the thoracenteses.  Cytologies were negative.  LDH was slightly elevated on 1 of the studies.  As far as I can determine he has not had an echocardiogram or a BNP checked recently.  The answer here is not to proceed to surgery or placement of an indwelling catheter.  The first step is to determine the underlying cause of his effusions.  Given the bilateral nature along with his peripheral edema I suspect this is heart failure related.    He needs to be on a higher dose of diuretics.  I am going to put him on Lasix 40 mg twice daily along with potassium 20 mEq twice daily.  He probably would benefit from a higher dose of ACE inhibitor as well.  But I will defer that to Dr. Earlie Counts  He needs an echocardiogram.  That can be done in Dr. Vincente Poli office.  We will call them and request that.  I am going to order a BNP and c-Met on him today.  He requested that I write an order for him to have thoracentesis done while he is in [place].  I am not comfortable with that.  I am also going to give him a low-dose prednisone taper.  I do not think this is inflammatory in nature, but there is potential benefit and no real downside to that.  Plan: Lasix 40 mg and potassium 20 mEq twice daily Prednisone taper 2D echocardiogram Return in 3 weeks with PA and lateral chest x-ray  Melrose Nakayama, MD [facility] [phone]

## 2017-05-13 ENCOUNTER — Other Ambulatory Visit: Payer: Self-pay | Admitting: *Deleted

## 2017-05-13 DIAGNOSIS — J90 Pleural effusion, not elsewhere classified: Secondary | ICD-10-CM

## 2017-05-13 LAB — BRAIN NATRIURETIC PEPTIDE: Brain Natriuretic Peptide: 309 pg/mL — ABNORMAL HIGH (ref <100)

## 2017-05-13 MED ORDER — PREDNISONE 5 MG PO TABS: ORAL_TABLET | ORAL | 0 refills | Status: DC

## 2017-05-15 ENCOUNTER — Telehealth: Payer: Self-pay

## 2017-05-15 NOTE — Telephone Encounter (Signed)
Patient's daughter called and had questions for patient about recent lab results.  Patient's daughter was told per Dr. Dorris Fetch lab results and that he would need to follow-up with his Cardiologist since office visit.  Patient is scheduled to have ECHO procedure done which his daughter confirmed.  She stated that she had lots of questions about PleurX catheter, to which I answered.  I also referred to Dr. Sunday Corn note in regards to route of care.  Patient's daughter was also reminded of patient's upcoming follow-up appointment with Dr. Dorris Fetch.  She acknowledged receipt.

## 2017-06-02 ENCOUNTER — Ambulatory Visit: Payer: Medicare Other | Admitting: Thoracic Surgery (Cardiothoracic Vascular Surgery)

## 2017-06-02 ENCOUNTER — Ambulatory Visit
Admission: RE | Admit: 2017-06-02 | Discharge: 2017-06-02 | Disposition: A | Discharge status: Home / Self Care | Payer: Medicare Other | Source: Ambulatory Visit | Attending: Thoracic Surgery (Cardiothoracic Vascular Surgery) | Admitting: Thoracic Surgery (Cardiothoracic Vascular Surgery)

## 2017-06-02 ENCOUNTER — Other Ambulatory Visit: Payer: Self-pay | Admitting: Thoracic Surgery (Cardiothoracic Vascular Surgery)

## 2017-06-02 ENCOUNTER — Encounter: Payer: Self-pay | Admitting: Thoracic Surgery (Cardiothoracic Vascular Surgery)

## 2017-06-02 VITALS — BP 130/75 | HR 82 | Resp 20 | Ht 73.0 in | Wt 191.0 lb

## 2017-06-02 DIAGNOSIS — J90 Pleural effusion, not elsewhere classified: Secondary | ICD-10-CM

## 2017-06-02 DIAGNOSIS — Z951 Presence of aortocoronary bypass graft: Secondary | ICD-10-CM | POA: Diagnosis not present

## 2017-06-02 MED ORDER — LISINOPRIL 10 MG PO TABS: 20.0000 mg | ORAL_TABLET | Freq: Every day | ORAL | 6 refills | Status: AC

## 2017-06-02 NOTE — Progress Notes (Signed)
[address]       [name] [address]             [phone]    HPI: Mr. Ozment returns for follow-up of his pleural effusions   Mr. Sivertsen is a 72 year old gentleman who presented in 2018 with shortness of breath and rapid atrial fibrillation.  He had moderate to severe mitral regurgitation.  He had severe two-vessel disease.  He underwent coronary bypass grafting x3, mitral valve repair, and a Maze procedure in September 2018.  He subsequent he had a pacemaker placed.  This spring he has been having difficulty with shortness of breath, leg swelling, and recurrent pleural effusions.  He has had multiple bilateral thoracenteses.  He was given low-dose Lasix briefly after his first few thoracenteses and then started on 20 mg daily.  I saw him in the office in late April.  I increased his Lasix to 40 mg twice a day.  I also gave him a prednisone taper.  His symptoms improved significantly after a couple of days.  He is still been going for thoracenteses.  They are now draining about a liter instead of 2 L.  He still has some swelling in his legs.  Lab tests at his last visit showed a BNP of 309.  His creatinine was 1.04.  He had an echo a couple weeks ago which showed ejection fraction of 55%.  Past Medical History:  Diagnosis Date  . A-fib ([facility])    persistent  . CAD, multiple vessel   . Cardiomyopathy ([facility])   . Dysrhythmia    afib  . Hyperlipidemia   . Hypertension   . Mitral regurgitation 07/30/2016   ECHO  . PAD (peripheral artery disease) ([facility])    FOLLOWED BY DR. Sondra Come, ON PLAVIX  . S/P cardiac cath 07/30/2016    Current Outpatient Medications  Medication Sig Dispense Refill  . amLODipine (NORVASC) 5 MG tablet Take 5 mg by mouth daily.    Marland Kitchen apixaban (ELIQUIS) 5 MG TABS tablet Take 5 mg by mouth daily.     Marland Kitchen atorvastatin (LIPITOR) 40 MG tablet Take 40 mg by mouth daily.    . clopidogrel (PLAVIX) 75 MG tablet Take 75 mg by mouth daily.    . furosemide (LASIX) 40  MG tablet Take 1 tablet (40 mg total) by mouth 2 (two) times daily. 60 tablet 5  . KRILL OIL PO Take 500 mg by mouth daily.     Marland Kitchen lisinopril (PRINIVIL,ZESTRIL) 10 MG tablet Take 2 tablets (20 mg total) by mouth daily. 30 tablet 6  . metoprolol succinate (TOPROL-XL) 50 MG 24 hr tablet Take 50 mg by mouth daily. Take with or immediately following a meal.    . potassium chloride SA (K-DUR,KLOR-CON) 20 MEQ tablet Take 1 tablet (20 mEq total) by mouth 2 (two) times daily. 60 tablet 5  . rOPINIRole (REQUIP) 0.25 MG tablet Take 1 tablet (0.25 mg total) by mouth at bedtime. X 2 NIGHTS, THEN INCREASE TO 2 TABLETS (0.5mg  total) 62 tablet 0   No current facility-administered medications for this visit.     Physical Exam BP 130/75   Pulse 82   Resp 20   Ht  (1.854 m)   Wt 191 lb (86.6 kg)   SpO2 97% Comment: on RA  BMI 25.55 kg/m  72 year old man in no acute distress Alert and oriented x3 with no focal deficits Lungs clear with equal breath sounds bilaterally Cardiac regular rate  and rhythm 2+ edema right lower extremity, 1+ left lower extremity  Diagnostic Tests: CHEST - 2 VIEW  COMPARISON:  Chest x-ray from yesterday.  FINDINGS: Unchanged left chest wall pacing device. The heart size and mediastinal contours are within normal limits. Prior CABG and mitral valve annuloplasty. Mild pulmonary vascular congestion. Unchanged small right pleural effusion with adjacent right lower lobe subsegmental atelectasis. The left lung is clear. No pneumothorax. No acute osseous abnormality.  IMPRESSION: 1. Unchanged small right pleural effusion and mild pulmonary vascular congestion.   Electronically Signed   By: Obie Dredge M.D.   On: 06/02/2017 09:09 I personally reviewed the chest x-ray concur with the findings noted above.  Impression: Mr. Volkert is a 72 year old gentleman who is about 8 months out from coronary bypass grafting x2, mitral valve repair, and Maze procedure.  He  has been having problems with bilateral pleural effusions.  He has responded to Prednisone taper and an increase in his Lasix.  He is still having scheduled bilateral thoracenteses on biweekly basis.  I do not think he needs to continue that unless he is symptomatic.  I do not think the solution to his problems is to place pleural catheters to manage his volume status.  I think the answer will be to improve his medical regimen.  Given his blood pressure today, I think he can tolerate an increase in his lisinopril.  I am going to change that to 20 mg daily.  We could consider another prednisone taper if his symptoms recur.   Plan: Increase lisinopril to 20 mg daily  Follow-up with Dr. Red Christians  I will see him back in 2 months with a chest x-ray.  Loreli Slot, MD [facility] [phone]

## 2017-07-28 DIAGNOSIS — I739 Peripheral vascular disease, unspecified: Secondary | ICD-10-CM | POA: Insufficient documentation

## 2017-08-03 ENCOUNTER — Other Ambulatory Visit: Payer: Self-pay | Admitting: Thoracic Surgery (Cardiothoracic Vascular Surgery)

## 2017-08-03 DIAGNOSIS — Z951 Presence of aortocoronary bypass graft: Secondary | ICD-10-CM

## 2017-08-04 ENCOUNTER — Encounter: Payer: Self-pay | Admitting: Internal Medicine

## 2017-08-04 ENCOUNTER — Other Ambulatory Visit: Payer: Self-pay

## 2017-08-04 ENCOUNTER — Ambulatory Visit: Payer: Medicare Other | Admitting: Thoracic Surgery (Cardiothoracic Vascular Surgery)

## 2017-08-04 ENCOUNTER — Ambulatory Visit
Admission: RE | Admit: 2017-08-04 | Discharge: 2017-08-04 | Disposition: A | Discharge status: Home / Self Care | Payer: Medicare Other | Source: Ambulatory Visit | Attending: Thoracic Surgery (Cardiothoracic Vascular Surgery) | Admitting: Thoracic Surgery (Cardiothoracic Vascular Surgery)

## 2017-08-04 ENCOUNTER — Encounter: Payer: Self-pay | Admitting: Thoracic Surgery (Cardiothoracic Vascular Surgery)

## 2017-08-04 VITALS — BP 117/64 | HR 80 | Resp 97 | Ht 73.0 in | Wt 190.0 lb

## 2017-08-04 DIAGNOSIS — I70203 Unspecified atherosclerosis of native arteries of extremities, bilateral legs: Secondary | ICD-10-CM | POA: Insufficient documentation

## 2017-08-04 DIAGNOSIS — J90 Pleural effusion, not elsewhere classified: Secondary | ICD-10-CM | POA: Insufficient documentation

## 2017-08-04 DIAGNOSIS — R7989 Other specified abnormal findings of blood chemistry: Secondary | ICD-10-CM | POA: Insufficient documentation

## 2017-08-04 DIAGNOSIS — Z951 Presence of aortocoronary bypass graft: Secondary | ICD-10-CM | POA: Diagnosis not present

## 2017-08-04 DIAGNOSIS — R0602 Shortness of breath: Secondary | ICD-10-CM | POA: Insufficient documentation

## 2017-08-04 DIAGNOSIS — R778 Other specified abnormalities of plasma proteins: Secondary | ICD-10-CM

## 2017-08-04 DIAGNOSIS — Z8679 Personal history of other diseases of the circulatory system: Secondary | ICD-10-CM | POA: Diagnosis not present

## 2017-08-04 DIAGNOSIS — Z9889 Other specified postprocedural states: Secondary | ICD-10-CM | POA: Diagnosis not present

## 2017-08-04 DIAGNOSIS — R945 Abnormal results of liver function studies: Secondary | ICD-10-CM

## 2017-08-04 DIAGNOSIS — I509 Heart failure, unspecified: Secondary | ICD-10-CM | POA: Insufficient documentation

## 2017-08-04 NOTE — Progress Notes (Signed)
[address]       [name] [address]             [phone]     HPI: [name] returns for follow-up of his pleural effusions.  [name] Green is a 72 year old man who had coronary bypass grafting x3, mitral valve repair, and a Maze procedure in September 2018.  He later had a pacemaker placed.  He has been having difficulty with shortness of breath, leg swelling, and recurrent pleural effusions.  He has had multiple bilateral thoracenteses. He did have an elevated BNP back in April. He has had adjustments to his Lasix dosing and his lisinopril dose has been increased as well.  Analysis of the pleural fluid did show an elevated LDH at one point and he received a prednisone taper.  Cytology was negative.  He continues to get short of breath after about a week and has continued to require multiple right thoracenteses.  He is only had his left pleural effusion drained twice in the past several months.  The fluid is not been sent for studies since April.  Says despite this he feels well.  He is able to do most activities.  He cannot do any heavy work without getting short of breath.  Past Medical History:  Diagnosis Date  . A-fib ([facility])    persistent  . CAD, multiple vessel   . Cardiomyopathy ([facility])   . Dysrhythmia    afib  . Hyperlipidemia   . Hypertension   . Mitral regurgitation 07/30/2016   ECHO  . PAD (peripheral artery disease) ([facility])    FOLLOWED BY DR. Sondra Green, ON PLAVIX  . S/P cardiac cath 07/30/2016    Current Outpatient Medications  Medication Sig Dispense Refill  . amLODipine (NORVASC) 5 MG tablet Take 5 mg by mouth daily.    Marland Kitchen apixaban (ELIQUIS) 5 MG TABS tablet Take 5 mg by mouth daily.     Marland Kitchen atorvastatin (LIPITOR) 40 MG tablet Take 40 mg by mouth daily.    . furosemide (LASIX) 40 MG tablet Take 1 tablet (40 mg total) by mouth 2 (two) times daily. 60 tablet 5  . KRILL OIL PO Take 1,500 mg by mouth daily.     Marland Kitchen lisinopril (PRINIVIL,ZESTRIL) 10 MG tablet  Take 2 tablets (20 mg total) by mouth daily. 30 tablet 6  . metoprolol succinate (TOPROL-XL) 50 MG 24 hr tablet Take 50 mg by mouth daily. Take with or immediately following a meal.    . potassium chloride SA (K-DUR,KLOR-CON) 20 MEQ tablet Take 1 tablet (20 mEq total) by mouth 2 (two) times daily. 60 tablet 5  . rOPINIRole (REQUIP) 0.25 MG tablet Take 1 tablet (0.25 mg total) by mouth at bedtime. X 2 NIGHTS, THEN INCREASE TO 2 TABLETS (0.5mg  total) 62 tablet 0   No current facility-administered medications for this visit.     Physical Exam BP 117/64 (BP Location: Right Arm, Patient Position: Sitting, Cuff Size: Large)   Pulse 80   Resp (!) 97 Comment: ON RA  Ht 6\' 1"  (1.854 m)   Wt 190 lb (86.2 kg)   BMI 25.07 kg/m  Respiratory rate of 97 is an obvious error 72 year old man in no acute distress Alert and oriented x3 with no focal deficits Cardiac regular rate and rhythm Lungs diminished at right base, otherwise clear  Diagnostic Tests: CHEST - 2 VIEW  COMPARISON:  Portable chest x-ray of July 31, 2017.  FINDINGS: The lungs are slightly  less well inflated today. There is a small right pleural effusion which is more conspicuous today. The heart is normal in size. There are post CABG and mitral valve repair changes. The pulmonary vascularity is not engorged. The interstitial markings are less conspicuous today. The ICD is in stable position.  IMPRESSION: Small right pleural effusion not greatly changed since the previous study allowing for differences in the degree of inflation of both lungs. Decreased pulmonary interstitial edema. Normal cardiac silhouette size without pulmonary vascular congestion.   Electronically Signed   By: David  Swazil[place]Jordan M.D.   On: 08/04/2017 10:11 I personally reviewed the chest x-ray images and concur with the findings noted above  Impression: [name] is a 72 year old man with a recurring right pleural effusion.  He has had bilateral pleural  effusions in the past, but now the right one is persistent while the left one has become less problematic.  Adjustments to his medication have helped his peripheral edema, but have not resolved the pleural effusions.  I suspect this effusion is multifactorial.  He has had an elevated LDH in the past which could indicate an inflammatory component.  I do think there is a component of heart failure as well although even though his heart failure overall is under much better control.  I discussed the possibility of placing a pleural catheter for management of the effusion.  He is going in essentially for scheduled weekly thoracenteses.  Pleural catheter would simplify things for him dramatically.  He does understand there is not necessarily a definitive endpoint to the pleural catheter in his case.  I informed him of the indications, risk, benefits, and alternatives (continued thoracentesis).  We discussed the relative pros and cons of the catheter.  He understands he would have a permanent indwelling catheter that would need ongoing care.  He understands the risk of insertion include reaction to medication, bleeding, pneumothorax or air leak, and catheter malposition.  There also is a risk of infection both short and long-term.  Long-term the catheter could also become occluded.  He has an appointment to see Dr. Marchelle Gearingamaswamy from pulmonary tomorrow.  He wants to talk to him before making a decision.  Plan: I will plan on seeing him back in 2 months unless he decides to go ahead and have pleural catheter placed, in which case we will go ahead and do that whenever he would like.  He will need to be off of Eliquis for 24 hours prior to catheter placement  Loreli SlotSteven C Tiron Suski, MD [facility] [phone]

## 2017-08-05 ENCOUNTER — Telehealth: Payer: Self-pay | Admitting: Internal Medicine

## 2017-08-05 ENCOUNTER — Ambulatory Visit: Payer: Medicare Other | Admitting: Internal Medicine

## 2017-08-05 ENCOUNTER — Encounter: Payer: Self-pay | Admitting: Internal Medicine

## 2017-08-05 VITALS — BP 140/70 | HR 79 | Ht 73.0 in | Wt 196.0 lb

## 2017-08-05 DIAGNOSIS — R0602 Shortness of breath: Secondary | ICD-10-CM | POA: Diagnosis not present

## 2017-08-05 DIAGNOSIS — J90 Pleural effusion, not elsewhere classified: Secondary | ICD-10-CM

## 2017-08-05 NOTE — Patient Instructions (Signed)
ICD-10-CM   1. Bilateral pleural effusion J90   2. Shortness of breath R06.02     Please get full name of your pulmonologist at [place] Please get all notes of your pulmonologist regarding pleural effusion Please get lab results of the fluid analyzed from pulmonologist   - in particular I want to know if autoimmune blood work was done  For 08/06/17 -> when they do the thoracentesis on right side ask them to do   - cell count, gram stain and culture, cytology for malignant cells, chemistries for LDH, albumin,  Protein, glucose, triglyceride and lipase  - you might have to have your pulmonologist at [place] send the order; they might not take mine  Next set of thoracentesis let us do here  IF it turns out you need surgical intervention - I will ask Dr Dorris FetchHendrickson about doing pleuroscopy exam before catheter. I will also ask his opinion about aprocedure called pleurodesis via talc as opposed to catheter  Followup 3-4 weeks with me Dr [name] [place] to regroup

## 2017-08-05 NOTE — Addendum Note (Signed)
Addended by: Wyvonne LenzPINION, Emylee Decelle P on: 08/05/2017 10:46 AM   Modules accepted: Orders

## 2017-08-05 NOTE — Telephone Encounter (Signed)
[name] Green had tried to call pt regarding thoracentesis based on today's consult with MR.  [facility] follow [facility].

## 2017-08-05 NOTE — Progress Notes (Signed)
Subjective:    Patient ID: [name] Green, male    DOB: 06-12-1945, 72 y.o.   MRN: [id number]  PCP [name] Green., MD   HPI   [name] Green 08/05/2017  Chief Complaint  Patient presents with  . Consult    Referred by [name] Green due to pleural effusion. Pt had PFT performed 09/12/16.  Pt has c/o SOB that has been going on x1 year.      [name] Green , 72 y.o. , with dob 08/28/1945 and male ,Not Hispanic or Latino from [place] [address] - presents to lung clinic for recurrent pleural effusion. History is gaed from talking to him and review of the past medical record. 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  He tells me he has had abrupt onset of shortness of breath sometime in the summer of 2018 approximately one year ago. He believes that he had pleural effusion at that time bilaterally right greater than left [and ultrasound imagearound that time confirms presence of bilateral pleural effusions]. He then went to work up and was thought to have severe coronary artery disease at [facility]. Coronary bypass was recommended. He chose [name] Green and underwent coronary bypass with maze procedure in September 2018 but he says this did not resolve her shortness of breath with exertion relieved by rest. Then starting spring 2019 the effusions got worse and he says since April 2019 he is had around 23 thoracentesisall but 3 on the right side. Only 3 on the left side. He said early on etiologic evaluations was done a few times. According to [name] Green note LDH was elevated. I do not have results of this in our computer system because all this evaluation was done at [facility]. He says he has a pulmonologist [name] Green in [facility] who is worked up extensively the etiologic reasons for effusion including liver images and nephrology evaluation but so far  etiologic workup has been negative. He does not recollect having autoimmune blood work but then he says he's had many different types of blood work at different points in time. At this point in time he is having weekly thoracentesis particularly on the right side which relieves his dyspnea on exertion almost completely. However in the effusion recurs he starts getting dyspneic again. He is scheduled for another thoracentesis tomorrow at [facility] and he does not want to change the appointment. He met with [name] Green and a Pleurx catheter has been recommended but he wants to think about it and part of the visit was to get a second opinion on this. Otherwise he feels great and is quite active  Of note he says that a few months ago he had 2 courses of prednisone which seemed to help and keep the pleural effusion at bay     Simple office walk 185 feet x  3 laps goal with forehead probe 08/05/2017   O2 used Room air  Number laps completed 3  Comments about pace Good pace  Resting Pulse Ox/HR 99% and 82/min  Final Pulse Ox/HR 94% and 100/min  Desaturated </= 88% no  Desaturated <= 3% points yes  Got Tachycardic >/= 90/min yes  Symptoms at end of test none  Miscellaneous comments x    Results for [name] Green (MRN [id number]) as of  08/05/2017 09:06  Ref. Range 05/12/2017 10:46  Creatinine Latest Ref Range: 0.70 - 1.18 mg/dL 1.04  Results for [name] Green (MRN [id number]) as of 08/05/2017 09:06  Ref. Range 05/12/2017 10:46  Brain Natriuretic Peptide Latest Ref Range: <100 pg/mL 309 (H)    Review of Systems  Constitutional: Negative for fever and unexpected weight change.  HENT: Negative for congestion, dental problem, ear pain, nosebleeds, postnasal drip, rhinorrhea, sinus pressure, sneezing, sore throat and trouble swallowing.   Eyes: Negative for redness and itching.  Respiratory: Positive for cough and shortness of breath. Negative for chest tightness and wheezing.     Cardiovascular: Positive for leg swelling. Negative for palpitations.  Gastrointestinal: Negative for nausea and vomiting.  Genitourinary: Negative for dysuria.  Musculoskeletal: Negative for joint swelling.  Skin: Negative for rash.  Allergic/Immunologic: Negative.  Negative for environmental allergies, food allergies and immunocompromised state.  Neurological: Negative for headaches.  Hematological: Bruises/bleeds easily.  Psychiatric/Behavioral: Negative for dysphoric mood. The patient is not nervous/anxious.        Objective:   Physical Exam  Constitutional: He is oriented to person, place, and time. He appears well-developed and well-nourished. No distress.  HENT:  Head: Normocephalic and atraumatic.  Right Ear: External ear normal.  Left Ear: External ear normal.  Mouth/Throat: Oropharynx is clear and moist. No oropharyngeal exudate.  Eyes: Pupils are equal, round, and reactive to light. Conjunctivae and EOM are normal. Right eye exhibits no discharge. Left eye exhibits no discharge. No scleral icterus.  Neck: Normal range of motion. Neck supple. No JVD present. No tracheal deviation present. No thyromegaly present.  Cardiovascular: Normal rate, regular rhythm and intact distal pulses. Exam reveals no gallop and no friction rub.  No murmur heard. Pulmonary/Chest: Effort normal and breath sounds normal. No respiratory distress. He has no wheezes. He has no rales. He exhibits no tenderness.  Breath sounds diminished right base Cabg scar in central chest  Abdominal: Soft. Bowel sounds are normal. He exhibits no distension and no mass. There is no tenderness. There is no rebound and no guarding.  Musculoskeletal: Normal range of motion. He exhibits no edema or tenderness.  Lymphadenopathy:    He has no cervical adenopathy.  Neurological: He is alert and oriented to person, place, and time. He has normal reflexes. No cranial nerve deficit. Coordination normal.  Skin: Skin is warm  and dry. No rash noted. He is not diaphoretic. No erythema. No pallor.  Psychiatric: He has a normal mood and affect. His behavior is normal. Judgment and thought content normal.  Nursing note and vitals reviewed.   Today's Vitals   08/05/17 0859  BP: 140/70  Pulse: 79  SpO2: 96%  Weight: 196 lb (88.9 kg)  Height: _0  (1.854 m)         Assessment & Plan:     ICD-10-CM   1. Bilateral pleural effusion J90   2. Shortness of breath R06.02    First ever like to start with clearing his old records and understanding the etiologic basis for this pleural effusion. The fact that he has had pleural effusion even before the bypass andhistory of partial response to prednisone suggests that we need to rule out autoimmune disorder. Also it is been a while since we have had etiologic workup therefore it is best to analyze the fluid again tomorrow. I will given him my recommendations for wart is to be done at [facility]. If we cannot find an etiology with the above measures and it  ends up being an exudate: The next algorithm approach would be to get a thoracoscopy to evaluate his pleural surface followed by pleurodesis mechanical means such as the Pleurx catheter or chemical. Disposition is to be taken in conjunction with [name] Green his surgeon who would know this area best  Patient is agreeable with this plan and verbalized understanding  okab   Please get full name of your pulmonologist at [place] Please get all notes of your pulmonologist regarding pleural effusion Please get lab results of the fluid analyzed from pulmonologist   - in particular I want to know if autoimmune blood work was done  For 08/06/17 -> when they do the thoracentesis on right side ask them to do   - cell count, gram stain and culture, cytology for malignant cells, chemistries for LDH, albumin,  Protein, glucose, triglyceride and lipase  - you might have to have your pulmonologist at [place] send the order;  they might not take mine  Next set of thoracentesis let us do here  IF it turns out you need surgical intervention - I will ask [name] Roxan Green about doing pleuroscopy exam before catheter. I will also ask his opinion about aprocedure called pleurodesis via talc as opposed to catheter  Followup 3-4 weeks with me [name] Brantley Persons to regroup     [name] Green, M.D., [facility].C.P Pulmonary and Critical Care Medicine Staff Physician, [place] Director - Interstitial Lung Disease  Program  Pulmonary [place] at [place], [place], [address]  Pager: [phone], If no answer or between  15:00h - 7:00h: call [phone] Telephone: [phone]

## 2017-08-06 ENCOUNTER — Ambulatory Visit (HOSPITAL_COMMUNITY): Admission: RE | Admit: 2017-08-06 | Payer: Medicare Other | Source: Ambulatory Visit

## 2017-08-06 NOTE — Telephone Encounter (Signed)
Spoke to pt he is having his US  Thoracentesis today@[facility]  @2pm  and id aware [name]

## 2017-08-07 ENCOUNTER — Telehealth: Payer: Self-pay | Admitting: Internal Medicine

## 2017-08-07 ENCOUNTER — Ambulatory Visit (HOSPITAL_COMMUNITY): Payer: Medicare Other

## 2017-08-07 NOTE — Telephone Encounter (Signed)
Spoke with Almyra FreeLibby [facility] scheduled thoracentesis tomorrow, 08/06/17.  Per Almyra FreeLibby, route the message to her so she could follow up on for pt and get thoracentesis rescheduled.

## 2017-08-07 NOTE — Telephone Encounter (Signed)
Spoke to this pt he is now scheduled for [facility] 08/10/17@11 :00am pt is aware of this 1st floor admitting [name] Green

## 2017-08-10 ENCOUNTER — Ambulatory Visit (HOSPITAL_COMMUNITY)
Admission: RE | Admit: 2017-08-10 | Discharge: 2017-08-10 | Disposition: A | Discharge status: Home / Self Care | Payer: Medicare Other | Source: Ambulatory Visit | Attending: Radiology | Admitting: Radiology

## 2017-08-10 ENCOUNTER — Ambulatory Visit (HOSPITAL_COMMUNITY)
Admission: RE | Admit: 2017-08-10 | Discharge: 2017-08-10 | Disposition: A | Discharge status: Home / Self Care | Payer: Medicare Other | Source: Ambulatory Visit | Attending: Internal Medicine | Admitting: Internal Medicine

## 2017-08-10 DIAGNOSIS — Z9889 Other specified postprocedural states: Secondary | ICD-10-CM

## 2017-08-10 DIAGNOSIS — J90 Pleural effusion, not elsewhere classified: Secondary | ICD-10-CM | POA: Insufficient documentation

## 2017-08-10 LAB — GLUCOSE, PLEURAL OR PERITONEAL FLUID: GLUCOSE FL: 132 mg/dL

## 2017-08-10 LAB — BODY FLUID CELL COUNT WITH DIFFERENTIAL
Lymphs, Fluid: 50 %
Monocyte-Macrophage-Serous Fluid: 47 % — ABNORMAL LOW (ref 50–90)
NEUTROPHIL FLUID: 3 % (ref 0–25)
WBC FLUID: 769 uL (ref 0–1000)

## 2017-08-10 LAB — PROTEIN, PLEURAL OR PERITONEAL FLUID: Total protein, fluid: <3 g/dL

## 2017-08-10 LAB — LACTATE DEHYDROGENASE, PLEURAL OR PERITONEAL FLUID: LD, Fluid: 79 U/L — ABNORMAL HIGH (ref 3–23)

## 2017-08-10 LAB — ALBUMIN, PLEURAL OR PERITONEAL FLUID: Albumin, Fluid: 1.1 g/dL

## 2017-08-10 MED ORDER — LIDOCAINE HCL 1 % IJ SOLN
INTRAMUSCULAR | Status: AC
  Filled 2017-08-10: qty 20

## 2017-08-10 NOTE — Procedures (Signed)
Ultrasound-guided diagnostic and therapeutic right thoracentesis performed yielding 2.4 liters of hazy, amber colored fluid. No immediate complications. Follow-up chest x-ray pending.

## 2017-08-11 LAB — TRIGLYCERIDES, BODY FLUIDS: Triglycerides, Fluid: <9 mg/dL

## 2017-08-12 ENCOUNTER — Telehealth: Payer: Self-pay | Admitting: Internal Medicine

## 2017-08-12 LAB — LIPASE, FLUID: LIPASE-FLUID: 4 U/L

## 2017-08-12 NOTE — Telephone Encounter (Signed)
08/05/17 OV notes, faxed to [phone], confirmation received.  Nothing further at this time.

## 2017-08-13 ENCOUNTER — Telehealth: Payer: Self-pay | Admitting: Internal Medicine

## 2017-08-13 DIAGNOSIS — J90 Pleural effusion, not elsewhere classified: Secondary | ICD-10-CM

## 2017-08-13 NOTE — Telephone Encounter (Signed)
Patient is returning call. CB is [phone]

## 2017-08-13 NOTE — Telephone Encounter (Signed)
Please advise what diagnosis to use for the abdomen ultrasound.

## 2017-08-13 NOTE — Telephone Encounter (Signed)
Pt is requesting his thoracentesis results.  MR - please advise. Thanks.

## 2017-08-13 NOTE — Telephone Encounter (Addendum)
1. Associate tests with Recurrent right pleural effusion - lease note the CT has to happen tomorrow before the effusion comes back (In June 2019 he did have a ct chest outside  but he had effusion)  2. Please let him know that after careful review of the records - a) Dr Elvis Coil is cardiology; b) Duane Boston is a physician assitant in radiology/medicine. I do not see where he has ever seen a pulmonary doc. In adddition, he has NOT had full workup for the fluid etiology. Hee told me he had but he really has not. Tehrefore  PLAn A. Go through CT chest as ordered B. Go through abd [place] as ordered C. Do blood TSH, Quantirferon Gold, and Serum: ESR, ACE, ANA, DS-DNA, RF, anti-CCP, ssA, ssB, scl-70,,  RNP, Aldolase,   - indication pleural effuson  Will call with all results to decide next steo

## 2017-08-13 NOTE — Telephone Encounter (Signed)
Spoke with pt  I advised we will be calling him to schedule additional testing  We are waiting to see what MR wants us to associate tests with  He verbalized understanding  He states that he has [facility]  His records are available to in Care Everywhere   Please advise what dx you want us to use for the ct and us thanks

## 2017-08-13 NOTE — Telephone Encounter (Signed)
Spoke with the pt and notified of recs per MR  All lab ordered were placed and he is aware to have labs done when goes for his us and ct  He is aware we will call him with results once received

## 2017-08-13 NOTE — Telephone Encounter (Signed)
  Reviewed fluid results  = seems like transudate but then he has 50% lymphocytes which would explain prior prednisone responmse  - reviewd [facility] chart but is cardiology notes. He had taps on right side 07/14/17 and 07/21/17 - amber fluid - Etiology still unclear  I explained all above to him Explained that trying to diagnose etiology without intervention and if unable to will need interventino diagnostic/therapeutic per Dr Dorris FetchHendrickson  Plan  - please query his [place] records from his pulmonologist Dr Rudolpho SevinAkbary - he says this is his pulmonary but google shows he is cards. So not sure if he ever saw pumonary. Please call [facility] and check if needed. IF he has never seen any pulmonary doc let me know and I will order autoimmune blood work + quantiferon gold   - please get CT chest without contrast  - see if can be done at [facility] [place] [facility], - this has to be done 08/13/2017 or 08/14/17   - please also get US RUQ and abdomet - see if can be done at [facility]  [place] - this can be done next few weks     - please send note back to me after setting everything up    Dr. Kalman ShanMurali Zena Vitelli, M.D., [facility].C.P Pulmonary and Critical Care Medicine Staff Physician, [facility] Director - Interstitial Lung Disease  Program  Pulmonary Fibrosis [facility] [place], [place], [id number]  Pager: [phone], If no answer or between  15:00h - 7:00h: call [phone] Telephone: [phone]

## 2017-08-14 ENCOUNTER — Ambulatory Visit (HOSPITAL_BASED_OUTPATIENT_CLINIC_OR_DEPARTMENT_OTHER): Admission: RE | Admit: 2017-08-14 | Payer: Medicare Other | Source: Ambulatory Visit

## 2017-08-14 ENCOUNTER — Telehealth: Payer: Self-pay | Admitting: Internal Medicine

## 2017-08-14 ENCOUNTER — Ambulatory Visit (HOSPITAL_BASED_OUTPATIENT_CLINIC_OR_DEPARTMENT_OTHER)
Admission: RE | Admit: 2017-08-14 | Discharge: 2017-08-14 | Disposition: A | Discharge status: Home / Self Care | Payer: Medicare Other | Source: Ambulatory Visit | Attending: Internal Medicine | Admitting: Internal Medicine

## 2017-08-14 DIAGNOSIS — J90 Pleural effusion, not elsewhere classified: Secondary | ICD-10-CM | POA: Diagnosis not present

## 2017-08-14 DIAGNOSIS — R161 Splenomegaly, not elsewhere classified: Secondary | ICD-10-CM | POA: Diagnosis not present

## 2017-08-14 DIAGNOSIS — I700 Atherosclerosis of aorta: Secondary | ICD-10-CM | POA: Diagnosis not present

## 2017-08-14 DIAGNOSIS — Z952 Presence of prosthetic heart valve: Secondary | ICD-10-CM | POA: Insufficient documentation

## 2017-08-14 DIAGNOSIS — I313 Pericardial effusion (noninflammatory): Secondary | ICD-10-CM | POA: Diagnosis not present

## 2017-08-14 DIAGNOSIS — I77819 Aortic ectasia, unspecified site: Secondary | ICD-10-CM | POA: Diagnosis not present

## 2017-08-14 DIAGNOSIS — J432 Centrilobular emphysema: Secondary | ICD-10-CM | POA: Insufficient documentation

## 2017-08-14 DIAGNOSIS — J9811 Atelectasis: Secondary | ICD-10-CM | POA: Diagnosis not present

## 2017-08-14 DIAGNOSIS — I2510 Atherosclerotic heart disease of native coronary artery without angina pectoris: Secondary | ICD-10-CM | POA: Diagnosis not present

## 2017-08-14 DIAGNOSIS — K7689 Other specified diseases of liver: Secondary | ICD-10-CM | POA: Insufficient documentation

## 2017-08-14 DIAGNOSIS — R590 Localized enlarged lymph nodes: Secondary | ICD-10-CM | POA: Insufficient documentation

## 2017-08-14 DIAGNOSIS — Z951 Presence of aortocoronary bypass graft: Secondary | ICD-10-CM | POA: Diagnosis not present

## 2017-08-14 NOTE — Telephone Encounter (Signed)
Spoke with Destiny with radiology she stated that she will fix the order. Nothing further needed.

## 2017-08-14 NOTE — Telephone Encounter (Signed)
Kris[facility] US tech at [facility] called Order needs to be changed and then MR needs to cosign it Patient is scheduled for 1730 today  Order changed, verified it was correct with Belenda CruiseKristin while on the phone with her Called spoke with MR who cosigned the order while I was on the phone  Nothing further needed; will sign off

## 2017-08-14 NOTE — Addendum Note (Signed)
Addended by: Boone MasterJONES, JESSICA E on: 08/14/2017 05:02 PM   Modules accepted: Orders

## 2017-08-14 NOTE — Telephone Encounter (Signed)
Sp[facility] with Thayer [facility]ris at [facility]  She states needing to know what organs MR wants viewed based on US  She states that if we just want to look for ascites, we can leave the order as is  If we are wanting to view other organs will need to change the order   RUQ US would show the gall bladder, liver, and common bile duct  If we want to view all organs it will need to be changed to a Complete Abdominal US   Please advise ASAP as pt is scheduled tonight for this

## 2017-08-14 NOTE — Telephone Encounter (Signed)
Yes complete Abd US  - look for ascites  - look at liver for cirhosis  - look at kidneys

## 2017-08-16 LAB — CULTURE, BODY FLUID-BOTTLE

## 2017-08-16 LAB — CULTURE, BODY FLUID W GRAM STAIN -BOTTLE: Culture: NO GROWTH

## 2017-08-17 ENCOUNTER — Telehealth: Payer: Self-pay | Admitting: Internal Medicine

## 2017-08-17 DIAGNOSIS — J90 Pleural effusion, not elsewhere classified: Secondary | ICD-10-CM

## 2017-08-17 NOTE — Telephone Encounter (Signed)
Attempted to contact pt. I did not receive an answer. There was no option for me to leave a message. Will try back.  

## 2017-08-17 NOTE — Telephone Encounter (Signed)
Pt can't do the appt. For today. Pt said that he can do the appt. tomorrow.   [phone]

## 2017-08-18 NOTE — Telephone Encounter (Signed)
LEt patient know  1. CT and [place] do not reveal cause of effusion 2. At this stage need him to do  Serum: ESR, ACE, ANA, DS-DNA, RF, anti-CCP, ssA, ssB, scl-70, ANCA screen, MPO, PR-3, Total CK,  RNP, Aldolase,  - try to do this week  Also do Serum: LDH, TOtal protein and albuming - do this week  Let him know that - when he is more short of breath will do 1 more fluid tap on the right side to look at cells again (Flow cytometry) and after that we need to take a decision on next steps. Pleas ask him when he thinks he will have enough fluid for 1 more tap    Ct Chest Wo Contrast  Result Date: 08/14/2017 CLINICAL DATA:  Right pleural effusion. Ongoing shortness of breath. EXAM: CT CHEST WITHOUT CONTRAST TECHNIQUE: Multidetector CT imaging of the chest was performed following the standard protocol without IV contrast. COMPARISON:  Chest radiograph dated 08/10/2017. FINDINGS: Cardiovascular: Atheromatous calcifications, including the coronary arteries and aorta. Small pericardial effusion with a maximum thickness of 10 mm. Post CABG changes and prosthetic mitral valve. Right subclavian pacer and AICD leads. Mediastinum/Nodes: Mildly prominent mediastinal nodes. The largest is a right anterior paratracheal node with a short axis diameter of 10 mm on image number 44 series 2. There also calcified right hilar and subcarinal nodes. Normal appearing thyroid gland. Lungs/Pleura: Moderate-sized right pleural effusion and small left pleural effusion. Mildly prominent interstitial markings. Mild diffuse bullous changes. Calcified granuloma at the right lung base. There is also some curvilinear atelectasis of the right lung base. Upper Abdomen: Tiny calcified granulomata in the liver and spleen. Partially included prominent left renal pelvis or parapelvic cyst. Musculoskeletal: Thoracic and lower cervical spine degenerative changes. IMPRESSION: 1. Moderate-sized right pleural effusion and small left pleural effusion.  2. Small pericardial effusion. 3. Right basilar subsegmental atelectasis. 4. Borderline mediastinal adenopathy. 5. 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 6. Calcific coronary artery and aortic atherosclerosis with post CABG changes and prosthetic mitral valve. 7. Previous granulomatous infection. Aortic Atherosclerosis (ICD10-I70.0) and Emphysema (ICD10-J43.9). Electronically Signed   By: Claudie Revering M.D.   On: 08/14/2017 22:04   US Abdomen Complete  Result Date: 08/14/2017 CLINICAL DATA:  Idiopathic pleural effusion EXAM: ABDOMEN ULTRASOUND COMPLETE COMPARISON:  MRI 08/22/2016, ultrasound 08/12/2016 FINDINGS: Gallbladder: Small nonshadowing foci in the gallbladder, may reflect small stones or tumefactive sludge measuring up to 5 mm. Negative sonographic Murphy. Normal wall thickness. Common bile duct: Diameter: 2 mm Liver: Stable left hepatic lobe cyst measuring 8 mm. Portal vein is patent on color Doppler imaging with normal direction of blood flow towards the liver. IVC: No abnormality visualized. Pancreas: Small hypoechoic lesion in the body of the pancreas measuring 6 mm, may correspond to 1 of the small lesions noted on comparison MRI further characterisation limited due to small size. Spleen: Slightly enlarged. Right Kidney: Length: 10.7 cm. Echogenicity within normal limits. No mass or hydronephrosis visualized. Left Kidney: Length: 11.1 cm. Cortical echogenicity within normal limits. No hydronephrosis. Cyst measuring 4.7 cm near the hilus. Abdominal aorta: Maximum AP diameter of 2.7 cm. Other findings: Bilateral right greater than left pleural effusions. IMPRESSION: 1. Small stones or tumefactive sludge in the gallbladder. Negative for biliary dilatation 2. Stable to slight decreased size of left hepatic lobe cyst 3. 6 mm hypoechoic lesion body of the pancreas, may correspond to 1 of cystic lesions noted on MRI. Small size limits further characterisation 4. Enlarged spleen 5. Bilateral  pleural effusions 6. AP aortic diameter of 2.7 cm. Ectatic abdominal aorta at risk for aneurysm development. Recommend followup by ultrasound in 5 years. This recommendation follows ACR consensus guidelines: White Paper of the ACR Incidental Findings Committee II on Vascular Findings. J Am Coll Radiol 2013; 10:789-794. Electronically Signed   By: Donavan Foil M.D.   On: 08/14/2017 22:06

## 2017-08-18 NOTE — Telephone Encounter (Signed)
Please have him do all the blood work this week - see bottom of thread  Also, repeat therapeutic tap right side -> as follows  = The fluid removal will be done by Interventional radiology   - they will remove not more than 1.5L - 2L   - fluid labs to be sent: cell count, gram stain and culture, cytology for malignant cells, chemistries for LDH, albumin,  Protein, glucose, triglyceride and lipase  - ALSO, order FLOW CYTOMETRY on the fluid - because is a lymphocyte predominant fluid in past

## 2017-08-18 NOTE — Telephone Encounter (Signed)
Pt is returning call. Cb is [phone].  

## 2017-08-18 NOTE — Telephone Encounter (Signed)
Patient called requesting to have a thoracentesis this week. Pt is having all labs completed 08/19/17 in [facility] location Pt states he called yesterday to have thoracentesis scheduled today, and no one called him back MR please advise

## 2017-08-18 NOTE — Telephone Encounter (Signed)
Pt is returning call. Cb is [phone].

## 2017-08-18 NOTE — Telephone Encounter (Signed)
Pt calling back to check on thoracentesis.  I advised that we were still waiting to hear back from MR, but assured pt that we would contact him once we received times from MR to schedule.  Pt expressed understanding.  Pt would like to have this done this week if possible.  MR please advise.  Thanks!

## 2017-08-19 NOTE — Telephone Encounter (Signed)
Spoke with Rodell PernaPatrice - no dpr on file Called spoke with patient, informed him of the above. DPR printed by Rodell PernaPatrice and placed in the mail to verified home address. Patient will bring this back to his scheduled to see MR on 8.22.19 @ 1000  Nothing further needed at this time; will sign off.

## 2017-08-19 NOTE — Telephone Encounter (Signed)
Order placed for IR Thoracentesis with all labs designated by MR Patient needs to come this week for serum labs as stated by MR  LMOM TCB x1

## 2017-08-19 NOTE — Telephone Encounter (Signed)
Called spoke with patient and discussed pending thoracentesis.  Pt is aware he will be contacted to schedule.  Pt will go to the [facility] location for serum labs tomorrow 08/20/17.  Patient also asked if we have documentation that we are able to discuss clinical information with his daughters.  Patient stated that he signed a DPR at [facility].  Unable to see this information in patient's chart.  Advised pt will look into this further and call him back >> pt advised okay to leave detailed message.

## 2017-08-19 NOTE — Telephone Encounter (Signed)
Patient is returning call. CB is [phone]

## 2017-08-20 ENCOUNTER — Other Ambulatory Visit: Payer: Self-pay | Admitting: Internal Medicine

## 2017-08-20 ENCOUNTER — Ambulatory Visit (HOSPITAL_COMMUNITY)
Admission: RE | Admit: 2017-08-20 | Discharge: 2017-08-20 | Disposition: A | Discharge status: Home / Self Care | Payer: Medicare Other | Source: Ambulatory Visit | Attending: Internal Medicine | Admitting: Internal Medicine

## 2017-08-20 ENCOUNTER — Encounter (HOSPITAL_COMMUNITY): Payer: Self-pay | Admitting: Radiology

## 2017-08-20 DIAGNOSIS — J90 Pleural effusion, not elsewhere classified: Secondary | ICD-10-CM

## 2017-08-20 HISTORY — PX: IR THORACENTESIS ASP PLEURAL SPACE W/IMG GUIDE: IMG5380

## 2017-08-20 LAB — BODY FLUID CELL COUNT WITH DIFFERENTIAL
LYMPHS FL: 58 %
Monocyte-Macrophage-Serous Fluid: 39 % — ABNORMAL LOW (ref 50–90)
Neutrophil Count, Fluid: 3 % (ref 0–25)
Total Nucleated Cell Count, Fluid: 670 cu mm (ref 0–1000)

## 2017-08-20 LAB — LACTATE DEHYDROGENASE, PLEURAL OR PERITONEAL FLUID: LD FL: 73 U/L — AB (ref 3–23)

## 2017-08-20 LAB — GLUCOSE, PLEURAL OR PERITONEAL FLUID: GLUCOSE FL: 124 mg/dL

## 2017-08-20 LAB — PROTEIN, PLEURAL OR PERITONEAL FLUID: Total protein, fluid: <3 g/dL

## 2017-08-20 LAB — GRAM STAIN

## 2017-08-20 LAB — ALBUMIN, PLEURAL OR PERITONEAL FLUID: ALBUMIN FL: 1.1 g/dL

## 2017-08-20 MED ORDER — LIDOCAINE HCL (PF) 2 % IJ SOLN
INTRAMUSCULAR | Status: AC
  Filled 2017-08-20: qty 20

## 2017-08-20 MED ORDER — LIDOCAINE HCL (PF) 2 % IJ SOLN
INTRAMUSCULAR | Status: AC | PRN
  Administered 2017-08-20: 10 mL

## 2017-08-20 NOTE — Procedures (Signed)
PROCEDURE SUMMARY:  Successful US guided right thoracentesis. Yielded 2 L of clear amber/red colored fluid. Pt tolerated procedure well. No immediate complications.  Specimen was sent for labs. CXR ordered.  Brayton ElBRUNING, Keyuna Cuthrell PA-C 08/20/2017 1:24 PM

## 2017-08-21 LAB — TRIGLYCERIDES, BODY FLUIDS

## 2017-08-24 ENCOUNTER — Other Ambulatory Visit (INDEPENDENT_AMBULATORY_CARE_PROVIDER_SITE_OTHER): Payer: Medicare Other

## 2017-08-24 ENCOUNTER — Other Ambulatory Visit: Payer: Self-pay | Admitting: *Deleted

## 2017-08-24 ENCOUNTER — Telehealth: Payer: Self-pay | Admitting: Internal Medicine

## 2017-08-24 DIAGNOSIS — J90 Pleural effusion, not elsewhere classified: Secondary | ICD-10-CM

## 2017-08-24 LAB — SEDIMENTATION RATE: Sed Rate: 56 mm/hr — ABNORMAL HIGH (ref 0–20)

## 2017-08-24 LAB — TSH: TSH: 1.09 u[IU]/mL (ref 0.35–4.50)

## 2017-08-24 NOTE — Telephone Encounter (Signed)
Spoke with [name] Green at [facility] [place] lab where pt had labs drawn and she stated labs have been sent out to both [facility] and [facility]. It will take between 5-7 days to get lab results back.  Once results have come back, the results will be faxed to our office.  Nothing further needed.

## 2017-08-24 NOTE — Telephone Encounter (Addendum)
Replaced all the orders that MR was wanting pt to have drawn as they were not placed as future the previous day they were placed.  Called Felicia in lab to see if she was able to see the ESR and TSH and per [facility], she was able to see them.  Stated to [facility] with the other orders that were placed, if pt needs to be stuck again to have the tests run, pt needs to know as soon as possible as we are trying to have the results of the tests as soon as we can to try to figure out what is going on with pt.   Felicia stated she would call either [facility] or [facility] and figure out something and then call me back to let me know.  Routing encounter to myself.

## 2017-08-24 NOTE — Addendum Note (Signed)
Addended by: Harley AltoPRICE, Isac Lincks M on: 08/24/2017 09:25 AM   Modules accepted: Orders

## 2017-08-24 NOTE — Addendum Note (Signed)
Addended by: Harley AltoPRICE, Theoden Mauch M on: 08/24/2017 09:50 AM   Modules accepted: Orders

## 2017-08-25 ENCOUNTER — Other Ambulatory Visit (INDEPENDENT_AMBULATORY_CARE_PROVIDER_SITE_OTHER): Payer: Medicare Other

## 2017-08-25 ENCOUNTER — Telehealth: Payer: Self-pay | Admitting: Internal Medicine

## 2017-08-25 DIAGNOSIS — J90 Pleural effusion, not elsewhere classified: Secondary | ICD-10-CM

## 2017-08-25 LAB — ANA+ENA+DNA/DS+ANTICH+CENTR
ANA TITER 1: NEGATIVE
Anti JO-1: <0.2 AI (ref 0.0–0.9)
Centromere Ab Screen: <0.2 AI (ref 0.0–0.9)
Chromatin Ab SerPl-aCnc: 0.7 AI (ref 0.0–0.9)
ENA SSA (RO) Ab: <0.2 AI (ref 0.0–0.9)

## 2017-08-25 LAB — CULTURE, BODY FLUID-BOTTLE: CULTURE: NO GROWTH

## 2017-08-25 LAB — CYCLIC CITRUL PEPTIDE ANTIBODY, IGG

## 2017-08-25 LAB — ANTI-SCLERODERMA ANTIBODY: SCLERODERMA (SCL-70) (ENA) ANTIBODY, IGG: NEGATIVE AI

## 2017-08-25 LAB — CULTURE, BODY FLUID W GRAM STAIN -BOTTLE

## 2017-08-25 LAB — SJOGREN'S SYNDROME ANTIBODS(SSA + SSB)
SSA (Ro) (ENA) Antibody, IgG: <1 AI
SSB (LA) (ENA) ANTIBODY, IGG: NEGATIVE AI

## 2017-08-25 LAB — RNP ANTIBODIES: ENA RNP Ab: <0.2 AI (ref 0.0–0.9)

## 2017-08-25 LAB — SJOGRENS SYNDROME-B EXTRACTABLE NUCLEAR ANTIBODY: SSB (La) (ENA) Antibody, IgG: <1 AI

## 2017-08-25 LAB — SJOGRENS SYNDROME-A EXTRACTABLE NUCLEAR ANTIBODY: SSA (Ro) (ENA) Antibody, IgG: <1 AI

## 2017-08-25 LAB — ANTI-DNA ANTIBODY, DOUBLE-STRANDED: ds DNA Ab: <1 IU/mL

## 2017-08-25 LAB — RHEUMATOID FACTOR

## 2017-08-25 LAB — ALBUMIN: ALBUMIN: 3.3 g/dL — AB (ref 3.5–5.2)

## 2017-08-25 LAB — PROTEIN, TOTAL: Total Protein: 6.4 g/dL (ref 6.0–8.3)

## 2017-08-25 LAB — RNP ANTIBODY: RIBONUCLEIC PROTEIN(ENA) ANTIBODY, IGG: NEGATIVE AI

## 2017-08-25 NOTE — Telephone Encounter (Signed)
Spoke with pt, states that he able to perform his daily tasks, just notes that he is more sob than baseline.  Pt states that he feels comfortable with waiting a few more days for lab results.    Called lab and spoke with Archie Pattenonya, states that they can add on the total protein and the albumin, but Archie Pattenonya has to call [facility] (where labs were drawn) to see if the LDH can be tested.  Archie Pattenonya will call right back with this info.  Will await call back.

## 2017-08-25 NOTE — Telephone Encounter (Signed)
Please let Patricia Pesahomas E Mcomber know that I am waiting for labs - I really do NOT want to be tapping him so erpeatedly. Wilfrid LundWil do if he is so short of breath that going to bathroom is tough but not otherwise. Now, I wan to wait and analyze results (still coming in) and discuss next steps  Also, please call lab and have them do a LDH serium, total protein, and albumin from blood lab yuesterday -most impportant on serum is the LDH.   Let me know answer to both  Thanks  Dr. Kalman ShanMurali Hollister Wessler, M.D., [facility]C.P Pulmonary and Critical Care Medicine Staff Physician, [facility] Center Director - Interstitial Lung Disease  Program  Pulmonary Fibrosis [facility] - Care Center Network at [facility] [place], [place], [address]  Pager: [phone], If no answer or between  15:00h - 7:00h: call [phone] Telephone: [phone]

## 2017-08-25 NOTE — Telephone Encounter (Signed)
Spoke with Clydie BraunKaren in the lab, verified that all three tests can be ran on yesterday's labwork.  Add-on sheet filled out and faxed to lab at [phone] attn Clydie BraunKaren, and orders placed in chart.  Forwarding to MR as FYI per his request.

## 2017-08-25 NOTE — Telephone Encounter (Signed)
lmtcb for pt.  

## 2017-08-25 NOTE — Telephone Encounter (Signed)
Pt called back, requesting an appt for a thoracentesis.  Pt states that he cannot do 8/14, requesting thoracentesis on this Thursday or Friday 8/15 or 8/16.    MR please advise.  Thanks

## 2017-08-26 LAB — LACTATE DEHYDROGENASE: LDH: 239 U/L (ref 120–250)

## 2017-08-26 LAB — QUANTIFERON-TB GOLD PLUS
NIL: 0.04 IU/mL
QuantiFERON-TB Gold Plus: NEGATIVE
TB1-NIL: 0 IU/mL
TB2-NIL: 0 IU/mL

## 2017-08-26 LAB — TEST AUTHORIZATION

## 2017-08-26 LAB — CYCLIC CITRUL PEPTIDE ANTIBODY, IGG/IGA: CYCLIC CITRULLIN PEPTIDE AB: 2 U (ref 0–19)

## 2017-08-26 LAB — ANA: Anti Nuclear Antibody(ANA): NEGATIVE

## 2017-08-26 LAB — ANGIOTENSIN CONVERTING ENZYME: Angiotensin-Converting Enzyme: 6 U/L — ABNORMAL LOW (ref 9–67)

## 2017-08-26 LAB — ALDOLASE: ALDOLASE: 8.1 U/L (ref <=8.1)

## 2017-08-27 NOTE — Telephone Encounter (Signed)
Pt is returning call. Per Pt, SOB increased since last speaking with nurse on 8/13. Per pt, if possible he would like to move forward with the Thoracentesis or if this is not possible he would like to have Prednisone called in. Cb is [phone]

## 2017-08-27 NOTE — Telephone Encounter (Signed)
-   [facility] and spoke with Malachi BondsGloria- she states not able to see any flow cytometry- will check with [facility] and call us back with an update  - spoke with him and notified of lab results so far- he verbalized understanding   - he has been taking requip for a year and was unsure about the amlodipine, but is sure it has been over a year  He takes the amlodipine for hypertension and the requip for his RLS. He will try stopping both meds. Will start back on requip if his legs are an issue   -I called the lab here and they were unable to provide an answer about the urine 24 protein- Tonya to call [facility] and find out and will call us back   - thora and cxr both ordered with indication to perform cxr first   - he understands rec on pred   Will forward to MR so he is aware of where we stand while awaiting call from lab and cytol/path

## 2017-08-27 NOTE — Telephone Encounter (Signed)
Awesome and strong job for handling this complex out of box situation [employer]lie

## 2017-08-27 NOTE — Telephone Encounter (Signed)
Update for MR-  Spoke with Angelique Blonderenise at [facility]  She states that the flow was not done  On the last fluid collection did not indicate that flow needed to be done b/c no lymphocytes were present   Also, spoke with Tonya in the lab here and she states they are able to do 24 hour protein  Order placed and pt aware

## 2017-08-27 NOTE — Telephone Encounter (Signed)
You're welcome! I placed new order for thora to include the flow cytometry  I also spoke with Archie Pattenonya and verified that the 24 hour urine test we ordered is what you wanted  Pt is scheduled for thora 08/28/17 at 9:45 am

## 2017-08-27 NOTE — Telephone Encounter (Signed)
Thanks. Appreciate you handling the complex case Verlon AuLeslie.   1. On 08/27/2017 - thora - reorder flow cytometry  2. 24h ur protein - pnease ensure with Tonya that means patient will collect urine in a jar for 24h and return it for protein analysis  Thanks  Dr. Kalman ShanMurali Klyn Kroening, M.D., [facility].P Pulmonary and Critical Care Medicine Staff Physician, [facility] Center Director - Interstitial Lung Disease  Program  Pulmonary Fibrosis [facility] - Care Center Network at [facility] [place], [place], [address]  Pager: [phone], If no answer or between  15:00h - 7:00h: call [phone] Telephone: [phone]

## 2017-08-27 NOTE — Telephone Encounter (Addendum)
Please let him know based on results so far  That at this point I do NOT think the fluid is because of a)  autoimmune disease; b) TB,; c) cancer .  I think the fluid is a "leaky " fluid - whic I will explain at offfice followup.  Let him know that becaues is a "leaky" fluid it poses a challenge in knowin exactluy what is casuing the problem because there were no clues on CT chest or US abdomen  Plan  - please call pathology /cytology and ask them if the flow cytometry has been done and I need that result   - please ask Patricia Pesahomas E Gonzalo - how long he has been on amlodipine and requip and why hhe is on this?  - I am wondering if these could be rare causes of this problems. Please ask him if he can stop this  - please order a 24 hour urine protein collection. You might have to call the lab to figutre out how to do it. Looking for protein leak in urine   - please order a therapeutic thoracentesis but he will need a cxr before the tap so I can see how much fluid he has accumulated in a week   - please tell him that decision on prednisone v surgery discussion with Dr Dorris FetchHendrickson based on above.. Let him know we respect his patience but we are coming to an end on his workup   Dr. Kalman ShanMurali Marsha Gundlach, M.D., [facility]C.P Pulmonary and Critical Care Medicine Staff Physician, [facility] Center Director - Interstitial Lung Disease  Program  Pulmonary Fibrosis [facility] -  Care Center Network at [facility] [place], [place], [address]  Pager: [phone], If no answer or between  15:00h - 7:00h: call [phone] Telephone: [phone]

## 2017-08-27 NOTE — Telephone Encounter (Signed)
Spoke with the pt  He states his breathing seems worse  He is SOB with "pretty much anything"- including just getting up to go to the bathroom  He states he feels "fine" other than the DOE and is denying any f/c/s, cough, CP, chest tightness  He is requesting either another tap or pred to be called in  He states that the pred seems to help  He wants to also know if MR has had a chance to review the labs  Please advise recs thanks

## 2017-08-27 NOTE — Telephone Encounter (Signed)
Debbie from Cytology is calling back [phone]

## 2017-08-28 ENCOUNTER — Other Ambulatory Visit: Payer: Self-pay | Admitting: *Deleted

## 2017-08-28 ENCOUNTER — Ambulatory Visit (HOSPITAL_COMMUNITY)
Admission: RE | Admit: 2017-08-28 | Discharge: 2017-08-28 | Disposition: A | Discharge status: Home / Self Care | Payer: Medicare Other | Source: Ambulatory Visit | Attending: Student | Admitting: Student

## 2017-08-28 ENCOUNTER — Encounter (HOSPITAL_COMMUNITY): Payer: Self-pay | Admitting: Student

## 2017-08-28 ENCOUNTER — Ambulatory Visit (HOSPITAL_COMMUNITY)
Admission: RE | Admit: 2017-08-28 | Discharge: 2017-08-28 | Disposition: A | Discharge status: Home / Self Care | Payer: Medicare Other | Source: Ambulatory Visit | Attending: Internal Medicine | Admitting: Internal Medicine

## 2017-08-28 ENCOUNTER — Other Ambulatory Visit (HOSPITAL_COMMUNITY): Payer: Self-pay | Admitting: Student

## 2017-08-28 ENCOUNTER — Other Ambulatory Visit: Payer: Medicare Other

## 2017-08-28 DIAGNOSIS — Z9889 Other specified postprocedural states: Secondary | ICD-10-CM

## 2017-08-28 DIAGNOSIS — J90 Pleural effusion, not elsewhere classified: Secondary | ICD-10-CM | POA: Diagnosis not present

## 2017-08-28 DIAGNOSIS — J9811 Atelectasis: Secondary | ICD-10-CM | POA: Insufficient documentation

## 2017-08-28 HISTORY — PX: IR THORACENTESIS ASP PLEURAL SPACE W/IMG GUIDE: IMG5380

## 2017-08-28 MED ORDER — LIDOCAINE HCL (PF) 2 % IJ SOLN
INTRAMUSCULAR | Status: AC
  Filled 2017-08-28: qty 20

## 2017-08-28 NOTE — Procedures (Signed)
PROCEDURE SUMMARY:  Successful US guided right diagnostic and therapeutic thoracentesis. Yielded 2.4 liters of amber fluid. Pt tolerated procedure well. No immediate complications.  Specimen was sent for labs. CXR ordered.  Hoyt KochKacie Sue-Ellen Cheryllynn Sarff PA-C 08/28/2017 11:38 AM

## 2017-08-28 NOTE — Progress Notes (Addendum)
Per Cristy HiltsEmily Pinion, CMA  Spoke with Cassie, radiology tech at [facility] who stated pt was currently there after having a thoracentesis and she wanted to know if any orders needed to be ordered.  Tried reaching out to MR to see if he wanted any labs ordered but unable to reach him.  While I had Cassie on the phone, with two sets of eyes, I went ahead and placed an order for flow cytometry to be done on the thoracentesis.  Nothing further needed.

## 2017-08-30 LAB — ALDOSTERONE + RENIN ACTIVITY W/ RATIO
ALDO / PRA Ratio: 3.9 ratio (ref 0.9–28.9)
Aldosterone: 3 ng/dL
Renin Activity: 0.76 ng/mL/h (ref 0.25–5.82)

## 2017-08-31 ENCOUNTER — Other Ambulatory Visit: Payer: Medicare Other

## 2017-08-31 DIAGNOSIS — J90 Pleural effusion, not elsewhere classified: Secondary | ICD-10-CM

## 2017-09-01 LAB — PROTEIN, URINE, 24 HOUR: Protein, 24H Urine: 180 mg/24 h — ABNORMAL HIGH (ref 0–149)

## 2017-09-01 LAB — EXTRA URINE SPECIMEN

## 2017-09-03 ENCOUNTER — Encounter: Payer: Self-pay | Admitting: Internal Medicine

## 2017-09-03 ENCOUNTER — Ambulatory Visit (INDEPENDENT_AMBULATORY_CARE_PROVIDER_SITE_OTHER)
Admission: RE | Admit: 2017-09-03 | Discharge: 2017-09-03 | Disposition: A | Discharge status: Home / Self Care | Payer: Medicare Other | Source: Ambulatory Visit | Attending: Internal Medicine | Admitting: Internal Medicine

## 2017-09-03 ENCOUNTER — Ambulatory Visit: Payer: Medicare Other | Admitting: Internal Medicine

## 2017-09-03 VITALS — BP 138/64 | HR 91 | Ht 73.0 in | Wt 194.0 lb

## 2017-09-03 DIAGNOSIS — J90 Pleural effusion, not elsewhere classified: Secondary | ICD-10-CM

## 2017-09-03 NOTE — Progress Notes (Signed)
Subjective:     Patient ID: [name] Green, male   DOB: May 25, 1945, 72 y.o.   MRN: [id number]  HPI    OV 09/03/2017  Chief Complaint  Patient presents with  . Follow-up    Pt is here to follow up on recent thoracentesis he had done and also to find out on recent lab work. Pt stated after the thoracentesis, he had a stent taken care of last Wednesday and since then, pt states he is feeling better. Pt denies any current complaints of cough, SOB, or CP.   72 year old male with recurrent right pleural effusion. I last saw him in July 2019. Since then he's had 2 or 3 thoracentesis. 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 There was some lymphocytes but flow cytometry did not show any monoclonal population. He had a TSH autoimmune workup and cytology all negative or nondiagnostic. He's had CT chest and alsoabdominal ultrasound that did not show any cirrhosis other than slightly enlarged spleen. So at this point I recommended he stop his amlodipine which is known to cause pedal edema/fluid retention. He says he stopped this one week ago and is beginning to feel better. He says normally at this time he would be significantly dyspneic. He thinks the amlodipine might be the cause of his pleural effusion although review of the literature suggests that it is not known to be associated with pleural effusion. In the interim he's had a peripheral arterial disease stent removed from his left lower extremity artery not otherwise specified. And that is helping with his claudication.There is no chest pain or any other issues.      has a past medical history of A-fib [facility]), CAD, multiple vessel, Cardiomyopathy (HCC), Dysrhythmia, Hyperlipidemia, Hypertension, Mitral regurgitation (07/30/2016), PAD (peripheral artery disease) (HCC), and S/P cardiac cath (07/30/2016).   reports that he quit smoking about 18 years ago. His smoking use included cigarettes. He has a 55.00  pack-year smoking history. He has never used smokeless tobacco.  Past Surgical History:  Procedure Laterality Date  . CARDIAC CATHETERIZATION    . CORONARY ARTERY BYPASS GRAFT N/A 09/17/2016   Procedure: CORONARY ARTERY BYPASS GRAFTING (CABG), ON PUMP, TIMES THREE, USING LEFT INTERNAL MAMMARY ARTERY AND ENDOSCOPICALLY HARVESTED LEFT GREATER SAPHENOUS VEIN;  Surgeon: Loreli Slot, MD;  Location: [facility] OR;  Service: Open Heart Surgery;  Laterality: N/A;  LIMA-LAD SVG-PD SVG-DIAG  . HERNIA REPAIR    . IR THORACENTESIS ASP PLEURAL SPACE W/IMG GUIDE  08/20/2017  . IR THORACENTESIS ASP PLEURAL SPACE W/IMG GUIDE  08/28/2017  . MAZE N/A 09/17/2016   Procedure: MAZE;  Surgeon: Loreli Slot, MD;  Location: [facility] OR;  Service: Open Heart Surgery;  Laterality: N/A;  . MITRAL VALVE REPAIR N/A 09/17/2016   Procedure: MITRAL VALVE REPAIR (MVR);  Surgeon: Loreli Slot, MD;  Location[facility] OR;  Service: Open Heart Surgery;  Laterality: N/A;  Using Size 30 Physio II Annuloplasty Ring  . REPAIR QUADRICEPS / HAMSTRING MUSCLE Left   . TEE WITHOUT CARDIOVERSION N/A 09/17/2016   Procedure: TRANSESOPHAGEAL ECHOCARDIOGRAM (TEE);  Surgeon: Loreli Slot, MD;  Location: [facility] OR;  Service: Open Heart Surgery;  Laterality: N/A;    No Known Allergies  Immunization History  Administered Date(s) Administered  . Pneumococcal Polysaccharide-23 07/11/2016    No family history on file.   Current Outpatient Medications:  .  apixaban (ELIQUIS) 5 MG TABS tablet, Take 5 mg by mouth daily. , Disp: ,  Rfl:  .  atorvastatin (LIPITOR) 40 MG tablet, Take 40 mg by mouth daily., Disp: , Rfl:  .  furosemide (LASIX) 40 MG tablet, Take 1 tablet (40 mg total) by mouth 2 (two) times daily., Disp: 60 tablet, Rfl: 5 .  KRILL OIL PO, Take 1,500 mg by mouth daily. , Disp: , Rfl:  .  lisinopril (PRINIVIL,ZESTRIL) 10 MG tablet, Take 2 tablets (20 mg total) by mouth daily., Disp: 30 tablet, Rfl: 6 .  metoprolol succinate  (TOPROL-XL) 50 MG 24 hr tablet, Take 50 mg by mouth daily. Take with or immediately following a meal., Disp: , Rfl:  .  potassium chloride SA (K-DUR,KLOR-CON) 20 MEQ tablet, Take 1 tablet (20 mEq total) by mouth 2 (two) times daily., Disp: 60 tablet, Rfl: 5 .  rOPINIRole (REQUIP) 2 MG tablet, One in am 2 at HS, Disp: , Rfl:    Review of Systems     Objective:   Physical Exam  Constitutional: He is oriented to person, place, and time. He appears well-developed and well-nourished. No distress.  HENT:  Head: Normocephalic and atraumatic.  Right Ear: External ear normal.  Left Ear: External ear normal.  Mouth/Throat: Oropharynx is clear and moist. No oropharyngeal exudate.  Eyes: Pupils are equal, round, and reactive to light. Conjunctivae and EOM are normal. Right eye exhibits no discharge. Left eye exhibits no discharge. No scleral icterus.  Neck: Normal range of motion. Neck supple. No JVD present. No tracheal deviation present. No thyromegaly present.  Cardiovascular: Normal rate, regular rhythm and intact distal pulses. Exam reveals no gallop and no friction rub.  No murmur heard. Pulmonary/Chest: Effort normal and breath sounds normal. No respiratory distress. He has no wheezes. He has no rales. He exhibits no tenderness.  = air entry bilaterally  Abdominal: Soft. Bowel sounds are normal. He exhibits no distension and no mass. There is no tenderness. There is no rebound and no guarding.  Musculoskeletal: Normal range of motion. He exhibits no edema or tenderness.  Lymphadenopathy:    He has no cervical adenopathy.  Neurological: He is alert and oriented to person, place, and time. He has normal reflexes. No cranial nerve deficit. Coordination normal.  Skin: Skin is warm and dry. No rash noted. He is not diaphoretic. No erythema. No pallor.  Psychiatric: He has a normal mood and affect. His behavior is normal. Judgment and thought content normal.  Nursing note and vitals  reviewed.  Vitals:   09/03/17 0949  BP: 138/64  Pulse: 91  SpO2: 99%  Weight: 194 lb (88 kg)  Height: 6\' 1"  (1.854 m)       Assessment:       ICD-10-CM   1. Pleural effusion J90        Plan:       You have a transudative "leaky" pleural effusion Despite extensive tests cause not known Only thing is mild leak in urine protein but not at significant levels   - unclear if test quality impacted by heat degradation of samples Glad you feel better with being off amlodipine  Plan Get cxr 2 view 09/03/2017 If no fluid we wil contiue to watch If fluid gets worse will do 1-2 month trial of prednisone If still not answer/not better - consider [facility] referral v repeat 24h urine protein Surgical PleurX to be entertained but after above   Followup 1 month to see me or app  > 50% of this > 25 min visit spent in face to face counseling or  coordination of care - by this undersigned MD - Dr Kalman ShanMurali Jennene Downie. This includes one or more of the following documented above: discussion of test results, diagnostic or treatment recommendations, prognosis, risks and benefits of management options, instructions, education, compliance or risk-factor reduction    Dr. Kalman ShanMurali Shamikia Linskey, M.D., [facility].C.P Pulmonary and Critical Care Medicine Staff Physician, [facility] Center Director - Interstitial Lung Disease  Program  Pulmonary Fibrosis [facility] - Care Center Network at [facility] [place], [place], [address]  Pager: [phone], If no answer or between  15:00h - 7:00h: call [phone] Telephone: [phone]

## 2017-09-03 NOTE — Addendum Note (Signed)
Addended by: Wyvonne LenzPINION, Grier Czerwinski P on: 09/03/2017 10:38 AM   Modules accepted: Orders

## 2017-09-03 NOTE — Patient Instructions (Addendum)
ICD-10-CM   1. Pleural effusion J90      You have a transudative "leaky" pleural effusion Despite extensive tests cause not known Only thing is mild leak in urine protein but not at significant levels   - unclear if test quality impacted by heat degradation of samples Glad you feel better with being off amlodipine  Plan Get cxr 2 view 09/03/2017 If no fluid we wil contiue to watch If fluid gets worse will do 1-2 month trial of prednisone If still not answer/not better - consider [facility] v repeat 24h urine protein Surgical PleurX to be entertained but after above   Followup 1 month to see me or app

## 2017-09-04 ENCOUNTER — Telehealth: Payer: Self-pay | Admitting: Internal Medicine

## 2017-09-04 NOTE — Telephone Encounter (Signed)
Called and spoke with patient, advised him that I would send a message over to the doctor to get results and give him a call once we have them. Patient verbalized understanding.   MR please advise on cxr results.

## 2017-09-04 NOTE — Telephone Encounter (Signed)
Called patient unable to reach left message to give us a call back.

## 2017-09-04 NOTE — Telephone Encounter (Signed)
Small right efuson  Plan He is to monitor clinically and if it gets worse - call - we will do a  cxr at that time and decide about prednisone/rpeeat thora  Dg Chest 2 View  Result Date: 09/03/2017 CLINICAL DATA:  Follow-up right pleural effusion EXAM: CHEST - 2 VIEW COMPARISON:  08/28/2017 chest radiograph. FINDINGS: Intact sternotomy wires. Cardiac valvular prosthesis is in place. Stable 2 lead left subclavian ICD. Stable cardiomediastinal silhouette with top-normal heart size. No pneumothorax. Small to moderate right pleural effusion is mildly increased. Stable trace left pleural effusion. No overt pulmonary edema. Patchy right lung base opacity is mildly increased. IMPRESSION: 1. Small to moderate right pleural effusion, mildly increased. 2. Stable trace left pleural effusion. 3. Patchy right lung base opacity, mildly increased, favor atelectasis. Electronically Signed   By: Delbert PhenixJason A Poff M.D.   On: 09/03/2017 12:18

## 2017-09-07 NOTE — Telephone Encounter (Signed)
Spoke with patient, he is aware of results. Nothing further needed at time of call.

## 2017-09-17 ENCOUNTER — Ambulatory Visit: Payer: Medicare Other | Admitting: Internal Medicine

## 2017-10-05 ENCOUNTER — Ambulatory Visit: Payer: Medicare Other | Admitting: Internal Medicine

## 2017-10-05 ENCOUNTER — Other Ambulatory Visit: Payer: Self-pay | Admitting: Thoracic Surgery (Cardiothoracic Vascular Surgery)

## 2017-10-05 ENCOUNTER — Encounter: Payer: Self-pay | Admitting: Internal Medicine

## 2017-10-05 VITALS — BP 118/64 | HR 78 | Ht 73.0 in | Wt 194.8 lb

## 2017-10-05 DIAGNOSIS — J90 Pleural effusion, not elsewhere classified: Secondary | ICD-10-CM

## 2017-10-05 NOTE — Progress Notes (Signed)
OV 09/03/2017  Chief Complaint  Patient presents with  . Follow-up    Pt is here to follow up on recent thoracentesis he had done and also to find out on recent lab work. Pt stated after the thoracentesis, he had a stent taken care of last Wednesday and since then, pt states he is feeling better. Pt denies any current complaints of cough, SOB, or CP.   72 year old male with recurrent right pleural effusion. I last saw him in July 2019. Since then he's had 2 or 3 thoracentesis. 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 There was some lymphocytes but flow cytometry did not show any monoclonal population. He had a TSH autoimmune workup and cytology all negative or nondiagnostic. He's had CT chest and alsoabdominal ultrasound that did not show any cirrhosis other than slightly enlarged spleen. So at this point I recommended he stop his amlodipine which is known to cause pedal edema/fluid retention. He says he stopped this one week ago and is beginning to feel better. He says normally at this time he would be significantly dyspneic. He thinks the amlodipine might be the cause of his pleural effusion although review of the literature suggests that it is not known to be associated with pleural effusion. In the interim he's had a peripheral arterial disease stent removed from his left lower extremity artery not otherwise specified. And that is helping with his claudication.There is no chest pain or any other issues.   OV 10/05/2017  Subjective:  Patient ID: [name] Green, male , DOB: [birth year] , age 39 y.o. , MRN: [id number] , ADDRESS: [address] [name] [address]   10/05/2017 -   Chief Complaint  Patient presents with  . Follow-up    Pt states he has been doing good since last visit. states since the amlodopine was discontinued, he has been able to do a lot of activities that he was not able to do before. States his breathing has been  doing better than before.     HPI [name] Green 72 y.o. - has recurrent right greater than left bilateral pleural effusion. Significant problems in the right. They idiopathic and transudative. He has had multiple thoracentesis. Finally stopped amlodipine. He presents now for follow-up. He tells me that after stopping amlodipine he started feeling better. There is less fluid retention. His dyspnea is much improved. He is able to do a lot more activities. He still does have some dyspnea on exertionbut it is nowhere as significantly worse as it was. His last chest x-ray was over a month ago He does not want another chest x-ray today. He is seeing thoracic surgery tomorrow for routine follow-up and will have his chest x-ray that. He also deferred flu shot.     ROS - per HPI     has a past medical history of A-fib (HCC), CAD, multiple vessel, Cardiomyopathy (HCC), Dysrhythmia, Hyperlipidemia, Hypertension, Mitral regurgitation (07/30/2016), PAD (peripheral artery disease) (HCC), and S/P cardiac cath (07/30/2016).   reports that he quit smoking about 18 years ago. His smoking use included cigarettes. He has a 55.00 pack-year smoking history. He has never used smokeless tobacco.  Past Surgical History:  Procedure Laterality Date  . CARDIAC CATHETERIZATION    . CORONARY ARTERY BYPASS GRAFT N/A 09/17/2016   Procedure: CORONARY ARTERY BYPASS GRAFTING (CABG), ON PUMP, TIMES THREE, USING LEFT INTERNAL MAMMARY ARTERY AND ENDOSCOPICALLY HARVESTED LEFT GREATER SAPHENOUS VEIN;  Surgeon:  Loreli SlotHendrickson, Steven C, MD;  [facility];  Service: Open Heart Surgery;  Laterality: N/A;  LIMA-LAD SVG-PD SVG-DIAG  . HERNIA REPAIR    . IR THORACENTESIS ASP PLEURAL SPACE W/IMG GUIDE  08/20/2017  . IR THORACENTESIS ASP PLEURAL SPACE W/IMG GUIDE  08/28/2017  . MAZE N/A 09/17/2016   Procedure: MAZE;  Surgeon: Loreli SlotHendrickson, Steven C, MD;  [facility];  Service: Open Heart Surgery;  Laterality: N/A;  . MITRAL VALVE REPAIR  N/A 09/17/2016   Procedure: MITRAL VALVE REPAIR (MVR);  Surgeon: Loreli SlotHendrickson, Steven C, MD;  [facility];  Service: Open Heart Surgery;  Laterality: N/A;  Using Size 30 Physio II Annuloplasty Ring  . REPAIR QUADRICEPS / HAMSTRING MUSCLE Left   . TEE WITHOUT CARDIOVERSION N/A 09/17/2016   Procedure: TRANSESOPHAGEAL ECHOCARDIOGRAM (TEE);  Surgeon: Loreli SlotHendrickson, Steven C, MD;  [facility];  Service: Open Heart Surgery;  Laterality: N/A;    No Known Allergies  Immunization History  Administered Date(s) Administered  . Pneumococcal Polysaccharide-23 07/11/2016    No family history on file.   Current Outpatient Medications:  .  apixaban (ELIQUIS) 5 MG TABS tablet, Take 5 mg by mouth daily. , Disp: , Rfl:  .  atorvastatin (LIPITOR) 40 MG tablet, Take 40 mg by mouth daily., Disp: , Rfl:  .  furosemide (LASIX) 40 MG tablet, Take 1 tablet (40 mg total) by mouth 2 (two) times daily., Disp: 60 tablet, Rfl: 5 .  KRILL OIL PO, Take 1,500 mg by mouth daily. , Disp: , Rfl:  .  lisinopril (PRINIVIL,ZESTRIL) 10 MG tablet, Take 2 tablets (20 mg total) by mouth daily., Disp: 30 tablet, Rfl: 6 .  metoprolol succinate (TOPROL-XL) 50 MG 24 hr tablet, Take 50 mg by mouth daily. Take with or immediately following a meal., Disp: , Rfl:  .  potassium chloride SA (K-DUR,KLOR-CON) 20 MEQ tablet, Take 1 tablet (20 mEq total) by mouth 2 (two) times daily., Disp: 60 tablet, Rfl: 5 .  rOPINIRole (REQUIP) 2 MG tablet, One in am 2 at HS, Disp: , Rfl:       Objective:   Vitals:   10/05/17 0926  BP: 118/64  Pulse: 78  SpO2: 96%  Weight: 194 lb 12.8 oz (88.4 kg)  Height: 6\' 1"  (1.854 m)    Estimated body mass index is 25.7 kg/m as calculated from the following:   Height as of this encounter: 6\' 1"  (1.854 m).   Weight as of this encounter: 194 lb 12.8 oz (88.4 kg).  @WEIGHTCHANGE @  [employer]   10/05/17 0926  Weight: 194 lb 12.8 oz (88.4 kg)     Physical Exam  General Appearance:    Alert,  cooperative, no distress, appears stated age - yes , sitting on - chair , Deconditioned looking - no  Head:    Normocephalic, without obvious abnormality, atraumatic  Eyes:    PERRL, conjunctiva/corneas clear,  Ears:    Normal TM's and external ear canals, both ears  Nose:   Nares normal, septum midline, mucosa normal, no drainage    or sinus tenderness. OXYGEN ON  - no . Patient is @ ra   Throat:   Lips, mucosa, and tongue normal; teeth and gums normal. Cyanosis on lips - no  Neck:   Supple, symmetrical, trachea midline, no adenopathy;    thyroid:  no enlargement/tenderness/nodules; no carotid   bruit or JVD  Back:     Symmetric, no curvature, ROM normal, no CVA tenderness  Lungs:  Distress - no , Wheeze no, Barrell Chest - no, Purse lip breathing - no, Crackles - no. Mildly diminished R Base - improved from before   Chest Wall:    No tenderness or deformity. Scars in chest yes - CABG   Heart:    Regular rate and rhythm, S1 and S2 normal, no rub   or gallop, Murmur - no  Breast Exam:    NOT DONE  Abdomen:     Soft, non-tender, bowel sounds active all four quadrants,    no masses, no organomegaly  Genitalia:   NOT DONE  Rectal:   NOT DONE  Extremities:   Extremities normal, atraumatic, Clubbing - no, Edema - no. VARCIOSE VEINS +. R LE   Pulses:   2+ and symmetric all extremities.   Skin:   Stigmata of Connective Tissue Disease - no  Lymph nodes:   Cervical, supraclavicular, and axillary nodes normal  Psychiatric:  Neurologic:   normal  CAm-ICU - neg, Alert and Oriented x 3 - yes, Moves all 4s - yes, Speech - normal, Cognition -  normal           Assessment:       ICD-10-CM   1. Pleural effusion J90        Plan:     Patient Instructions     ICD-10-CM   1. Pleural effusion J90     Glad you feel better after stopping amlodipine; this looking back was likely contributing to fluid collection/retention  Plan CXR 10/06/17 with Dr Dorris Fetch If fluid coming back my  next recommendation is to trial steroids for 1-3 months  Flu shot deferred 10/05/2017 at your request  Followup 3 months or sooner if needed  (> 50% of this 15 min visit spent in face to face counseling or/and coordination of care by this undersigned MD - Dr Kalman Shan. This includes one or more of the following documented above: discussion of test results, diagnostic or treatment recommendations, prognosis, risks and benefits of management options, instructions, education, compliance or risk-factor reduction)    SIGNATURE    Dr. Kalman Shan, M.D., F.C.C.P,  Pulmonary and Critical Care Medicine Staff Physician, [facility] Director - Interstitial Lung Disease  Program  Pulmonary Fibrosis [facility] at [facility] [place], [place], [address]  Pager: [phone], If no answer or between  15:00h - 7:00h: call [phone] Telephone: [phone]  10:00 AM 10/05/2017

## 2017-10-05 NOTE — Patient Instructions (Signed)
ICD-10-CM   1. Pleural effusion J90     Glad you feel better after stopping amlodipine; this looking back was likely contributing to fluid collection/retention  Plan CXR 10/06/17 with Dr Dorris FetchHendrickson If fluid coming back my next recommendation is to trial steroids for 1-3 months  Flu shot deferred 10/05/2017 at your request  Followup 3 months or sooner if needed

## 2017-10-06 ENCOUNTER — Ambulatory Visit
Admission: RE | Admit: 2017-10-06 | Discharge: 2017-10-06 | Disposition: A | Discharge status: Home / Self Care | Payer: Medicare Other | Source: Ambulatory Visit | Attending: Thoracic Surgery (Cardiothoracic Vascular Surgery) | Admitting: Thoracic Surgery (Cardiothoracic Vascular Surgery)

## 2017-10-06 ENCOUNTER — Ambulatory Visit: Payer: Medicare Other | Admitting: Thoracic Surgery (Cardiothoracic Vascular Surgery)

## 2017-10-06 ENCOUNTER — Encounter: Payer: Self-pay | Admitting: Thoracic Surgery (Cardiothoracic Vascular Surgery)

## 2017-10-06 ENCOUNTER — Other Ambulatory Visit: Payer: Self-pay

## 2017-10-06 VITALS — BP 134/80 | HR 82 | Resp 16 | Ht 73.0 in | Wt 194.0 lb

## 2017-10-06 DIAGNOSIS — J90 Pleural effusion, not elsewhere classified: Secondary | ICD-10-CM

## 2017-10-06 DIAGNOSIS — Z9889 Other specified postprocedural states: Secondary | ICD-10-CM | POA: Diagnosis not present

## 2017-10-06 DIAGNOSIS — Z8679 Personal history of other diseases of the circulatory system: Secondary | ICD-10-CM | POA: Diagnosis not present

## 2017-10-06 DIAGNOSIS — Z951 Presence of aortocoronary bypass graft: Secondary | ICD-10-CM | POA: Diagnosis not present

## 2017-10-06 NOTE — Progress Notes (Signed)
[address]       [name] [address]             [phone]      HPI: [name] Green returns for follow-up of his effusions.  [name] Green is a 72 year old man who had coronary bypass grafting, mitral valve repair, and a Maze procedure in September 2018.  He did well initially but then started having problems with recurrent pleural effusions.  He has had multiple bilateral thoracenteses.  He did have some medication adjustments.  He was treated with prednisone taper because of a mildly elevated LDH at one point.  Cytologies have been negative.  I saw him about a month ago.  I offered him the option of placing a pleural catheter although I do not think it is the answer, which is treatment of his underlying problem.  I did think it might simplify his management.  He was uncertain about having a pleural catheter placed.  He saw Dr. Marchelle Green and has had an extensive work-up.  He had another thoracentesis which showed a transudate.  He had a CT of the chest which did not show any underlying cause.  His amlodipine was discontinued and [name] Green noted an immediate improvement in his breathing and exercise tolerance.  Past Medical History:  Diagnosis Date  . A-fib (HCC)    persistent  . CAD, multiple vessel   . Cardiomyopathy (HCC)   . Dysrhythmia    afib  . Hyperlipidemia   . Hypertension   . Mitral regurgitation 07/30/2016   ECHO  . PAD (peripheral artery disease) (HCC)    FOLLOWED BY DR. Sondra Green, ON PLAVIX  . S/P cardiac cath 07/30/2016     Current Outpatient Medications  Medication Sig Dispense Refill  . apixaban (ELIQUIS) 5 MG TABS tablet Take 5 mg by mouth daily.     Marland Kitchen atorvastatin (LIPITOR) 40 MG tablet Take 40 mg by mouth daily.    . furosemide (LASIX) 40 MG tablet Take 1 tablet (40 mg total) by mouth 2 (two) times daily. 60 tablet 5  . KRILL OIL PO Take 1,500 mg by mouth daily.     Marland Kitchen lisinopril (PRINIVIL,ZESTRIL) 10 MG tablet Take 2 tablets (20 mg total) by  mouth daily. 30 tablet 6  . metoprolol succinate (TOPROL-XL) 50 MG 24 hr tablet Take 50 mg by mouth daily. Take with or immediately following a meal.    . potassium chloride SA (K-DUR,KLOR-CON) 20 MEQ tablet Take 1 tablet (20 mEq total) by mouth 2 (two) times daily. 60 tablet 5  . rOPINIRole (REQUIP) 2 MG tablet 2 mg. MAY TAKE UP TO 5 PER DAY     No current facility-administered medications for this visit.     Physical Exam BP 134/80 (BP Location: Right Arm, Patient Position: Sitting, Cuff Size: Normal)   Pulse 82   Resp 16   Ht 6\' 1"  (1.854 m)   Wt 194 lb (88 kg)   SpO2 98% Comment: ON RA  BMI 25.63 kg/m  72 year old man in no acute distress Alert and oriented x3 with no focal deficits Cardiac regular rate and rhythm with 2/6 murmur Lungs diminished at right base, otherwise clear Trace peripheral edema  Diagnostic Tests: CHEST - 2 VIEW  COMPARISON:  Chest x-ray of 09/03/2017  FINDINGS: There is little change in volume of the small to moderate right pleural effusion with right basilar volume loss. A smaller left pleural effusion remains. There is still some haziness particularly  on the lateral view of the right lung base which may represent atelectasis but pneumonia cannot be excluded. Heart size is stable and mitral valve replacement is noted as well as changes of median sternotomy and CABG. AICD leads and pacer leads remain. No acute bony abnormality is seen.  IMPRESSION: 1. Little change in pleural effusions right greater than left with right basilar volume loss. Cannot exclude pneumonia at the right lung base. 2. Stable AICD and pacer leads.   Electronically Signed   By: [name] DeePaul  Green M.D.   On: 10/06/2017 09:14 I personally reviewed the x-ray images and concur with the findings noted above  Impression: [name] Green is a [age] year old gentleman with bilateral transudative pleural effusions.  These been difficult to control and he has had too numerous to count  thoracenteses over the past year.  Cytology is in place cytometry as were negative.  Fluid analysis was consistent with transudate.  He did have a couple of prednisone tapers which may have helped a little bit.  Interestingly since his amlodipine was discontinued by Dr. Marchelle Gearingamaswamy his symptoms have improved dramatically and his chest x-rays remained stable for the past month.   Plan: Follow-up with Dr. Marchelle Gearingamaswamy.  I will be happy to see [name] Green back at any time.  [name] SlotSteven C Alisia Vanengen, MD [facility] [phone]

## 2017-10-26 ENCOUNTER — Ambulatory Visit (INDEPENDENT_AMBULATORY_CARE_PROVIDER_SITE_OTHER)
Admission: RE | Admit: 2017-10-26 | Discharge: 2017-10-26 | Disposition: A | Discharge status: Home / Self Care | Payer: Medicare Other | Source: Ambulatory Visit | Attending: Acute Care | Admitting: Acute Care

## 2017-10-26 ENCOUNTER — Telehealth: Payer: Self-pay | Admitting: Internal Medicine

## 2017-10-26 ENCOUNTER — Ambulatory Visit: Payer: Medicare Other | Admitting: Nurse Practitioner

## 2017-10-26 ENCOUNTER — Encounter: Payer: Self-pay | Admitting: Nurse Practitioner

## 2017-10-26 VITALS — BP 142/78 | HR 87 | Ht 73.0 in | Wt 204.1 lb

## 2017-10-26 DIAGNOSIS — J90 Pleural effusion, not elsewhere classified: Secondary | ICD-10-CM | POA: Diagnosis not present

## 2017-10-26 DIAGNOSIS — Z8709 Personal history of other diseases of the respiratory system: Secondary | ICD-10-CM | POA: Diagnosis not present

## 2017-10-26 DIAGNOSIS — R0602 Shortness of breath: Secondary | ICD-10-CM | POA: Diagnosis not present

## 2017-10-26 NOTE — Telephone Encounter (Signed)
Pt is calling back [phone]

## 2017-10-26 NOTE — Patient Instructions (Signed)
Chest xray today was stable from last check. Bilateral small pleural effusions unchanged from last scan.  Take an extra lasix (40 mg) in the am for the next 3 days - please follow up with cardiology as soon as possible Continue all other medications Please follow up with Dr. Marchelle Gearing in 1 week or sooner if needed

## 2017-10-26 NOTE — Telephone Encounter (Signed)
Called pt back who stated he could come in today for an OV.  Scheduled pt for an acute visit today at 3pm with Angus Seller, NP.  Stated to pt to head downstairs first for a cxr before coming upstairs for the visit.  Pt expressed understanding. cxr has been ordered. Nothing further needed.

## 2017-10-26 NOTE — Telephone Encounter (Signed)
He needs to be seen. He needs to have a CXR to make sure his effusion is not back. It has been almost a month since his last CXR and with the change of worsening shortness of breath we need to make sure this  is not a fluid issue before treating with steroids.

## 2017-10-26 NOTE — Telephone Encounter (Signed)
Called and spoke with patient.  He states he is SOB, has a slight cough at night, but can't tell what color sputum is. Patient states MR mentioned starting on prednisone if patient still was having trouble.  SG please advise   No Known Allergies Current Outpatient Medications on File Prior to Visit  Medication Sig Dispense Refill  . apixaban (ELIQUIS) 5 MG TABS tablet Take 5 mg by mouth daily.     Marland Kitchen atorvastatin (LIPITOR) 40 MG tablet Take 40 mg by mouth daily.    . furosemide (LASIX) 40 MG tablet Take 1 tablet (40 mg total) by mouth 2 (two) times daily. 60 tablet 5  . KRILL OIL PO Take 1,500 mg by mouth daily.     Marland Kitchen lisinopril (PRINIVIL,ZESTRIL) 10 MG tablet Take 2 tablets (20 mg total) by mouth daily. 30 tablet 6  . metoprolol succinate (TOPROL-XL) 50 MG 24 hr tablet Take 50 mg by mouth daily. Take with or immediately following a meal.    . potassium chloride SA (K-DUR,KLOR-CON) 20 MEQ tablet Take 1 tablet (20 mEq total) by mouth 2 (two) times daily. 60 tablet 5  . rOPINIRole (REQUIP) 2 MG tablet 2 mg. MAY TAKE UP TO 5 PER DAY     No current facility-administered medications on file prior to visit.

## 2017-10-26 NOTE — Telephone Encounter (Signed)
Called and spoke with pt letting him know we need to get him scheduled for an OV with a cxr before to make sure the fluid is not coming back before we do any treatment with steroids.  Pt expressed understanding. Pt stated he would need to make a quick phone call to see if someone could pick up the grandkids from school and then he would call us back to let us know if he could come in this afternoon for an appt.  Will await return call from pt. Told pt to ask for me when he called back.

## 2017-10-26 NOTE — Progress Notes (Signed)
@Patient  ID: [name] Green, male    DOB: June 19, 1945, 72 y.o.   MRN: [id number]  Chief Complaint  Patient presents with  . Shortness of Breath    Referring provider: Laqueta Due., MD   HPI  72 year old male with bilateral pleural effusions more significant in the right with a history of multiple thoracentesis. He has stopped amlodipine and symptoms have greatly improved. He is followed by Dr. Marchelle Gearing.  Health history includes CHF, PAD, HTN, S/P CABG X 3, S/P MAZE operation for a-fib.   Tests:  10/06/17 chest x ray - Little change in pleural effusions right greater than left with right basilar volume loss. Cannot exclude pneumonia at the right lung base. Stable AICD and pacer leads. 10/26/17 chest x ray - Stable chest radiograph findings. Persistent bilateral pleural effusions, right side greater than left. Right pleural effusion is small to moderate in size. Stable prominent interstitial lung markings that could represent chronic changes versus edema. Favor chronic changes based on the comparison studies. Started getting short of breath about a week ago.   OV 10/27/17 - acute shortness of breath Patient presents today with shortness of breath x 3 days. States that symptoms are slightly worsening. He is concerned that his pleural effusions have worsened again. He is having a harder time with ADL's. He denies any chest pain or fever. Denies cough. He has been compliant with current medications.      No Known Allergies  Immunization History  Administered Date(s) Administered  . Pneumococcal Polysaccharide-23 07/11/2016    Past Medical History:  Diagnosis Date  . A-fib (HCC)    persistent  . CAD, multiple vessel   . Cardiomyopathy (HCC)   . Dysrhythmia    afib  . Hyperlipidemia   . Hypertension   . Mitral regurgitation 07/30/2016   ECHO  . PAD (peripheral artery disease) (HCC)    FOLLOWED BY DR. Sondra Come, ON PLAVIX  . S/P cardiac cath 07/30/2016    Tobacco  History: Social History   Tobacco Use  Smoking Status Former Smoker  . Packs/day: 1.00  . Years: 55.00  . Pack years: 55.00  . Types: Cigarettes  . Last attempt to quit: 01/14/1999  . Years since quitting: 18.7  Smokeless Tobacco Never Used   Counseling given: Yes   Outpatient Encounter Medications as of 10/26/2017  Medication Sig  . apixaban (ELIQUIS) 5 MG TABS tablet Take 5 mg by mouth daily.   Marland Kitchen atorvastatin (LIPITOR) 40 MG tablet Take 40 mg by mouth daily.  . furosemide (LASIX) 40 MG tablet Take 1 tablet (40 mg total) by mouth 2 (two) times daily.  Marland Kitchen KRILL OIL PO Take 1,500 mg by mouth daily.   Marland Kitchen lisinopril (PRINIVIL,ZESTRIL) 10 MG tablet Take 2 tablets (20 mg total) by mouth daily.  . metoprolol succinate (TOPROL-XL) 50 MG 24 hr tablet Take 50 mg by mouth daily. Take with or immediately following a meal.  . potassium chloride SA (K-DUR,KLOR-CON) 20 MEQ tablet Take 1 tablet (20 mEq total) by mouth 2 (two) times daily.  Marland Kitchen rOPINIRole (REQUIP) 2 MG tablet 2 mg. MAY TAKE UP TO 5 PER DAY   No facility-administered encounter medications on file as of 10/26/2017.      Review of Systems  Review of Systems  Constitutional: Negative.  Negative for chills and fever.  HENT: Negative.  Negative for congestion.   Respiratory: Positive for shortness of breath. Negative for cough and wheezing.   Cardiovascular: Negative.  Negative for chest  pain, palpitations and leg swelling.  Gastrointestinal: Negative.   Allergic/Immunologic: Negative.   Neurological: Negative.   Psychiatric/Behavioral: Negative.        Physical Exam  BP (!) 142/78 (BP Location: Right Arm, Patient Position: Sitting, Cuff Size: Normal)   Pulse 87   Ht 6\' 1"  (1.854 m)   Wt 204 lb 1.6 oz (92.6 kg)   SpO2 93%   BMI 26.93 kg/m   Wt Readings from Last 5 Encounters:  10/26/17 204 lb 1.6 oz (92.6 kg)  10/06/17 194 lb (88 kg)  10/05/17 194 lb 12.8 oz (88.4 kg)  09/03/17 194 lb (88 kg)  08/05/17 196 lb (88.9  kg)     Physical Exam  Constitutional: He is oriented to person, place, and time. He appears well-developed and well-nourished. No distress.  Cardiovascular: Normal rate and regular rhythm.  Pulmonary/Chest: Effort normal and breath sounds normal. He has no wheezes. He has no rhonchi. He has no rales.  Musculoskeletal:       Right lower leg: He exhibits no edema.  Neurological: He is alert and oriented to person, place, and time.  Skin: Skin is warm and dry.  Psychiatric: He has a normal mood and affect.  Nursing note and vitals reviewed.   Imaging: Dg Chest 2 View  Result Date: 10/26/2017 CLINICAL DATA:  Shortness of breath.  Follow-up pleural effusion. EXAM: CHEST - 2 VIEW COMPARISON:  10/06/2017 FINDINGS: Stable appearance of the left cardiac ICD. Previous median sternotomy and cardiac valve surgery. Basilar chest densities are most compatible with pleural effusions with associated atelectasis. Right pleural effusion is larger than left and similar to the prior examination. Again noted are densities in the anterior right lower lobe region. Prominent interstitial lung markings are unchanged and could represent chronic changes versus mild edema. Heart size is within normal limits and stable. Trachea is midline. Negative for a pneumothorax. IMPRESSION: Stable chest radiograph findings. Persistent bilateral pleural effusions, right side greater than left. Right pleural effusion is small to moderate in size. Stable prominent interstitial lung markings that could represent chronic changes versus edema. Favor chronic changes based on the comparison studies. Electronically Signed   By: Richarda Overlie M.D.   On: 10/26/2017 14:17   Dg Chest 2 View  Result Date: 10/06/2017 CLINICAL DATA:  History of mitral valve repair in September of 2018, recurrent bilateral pleural effusions, follow-up EXAM: CHEST - 2 VIEW COMPARISON:  Chest x-ray of 09/03/2017 FINDINGS: There is little change in volume of the small  to moderate right pleural effusion with right basilar volume loss. A smaller left pleural effusion remains. There is still some haziness particularly on the lateral view of the right lung base which may represent atelectasis but pneumonia cannot be excluded. Heart size is stable and mitral valve replacement is noted as well as changes of median sternotomy and CABG. AICD leads and pacer leads remain. No acute bony abnormality is seen. IMPRESSION: 1. Little change in pleural effusions right greater than left with right basilar volume loss. Cannot exclude pneumonia at the right lung base. 2. Stable AICD and pacer leads. Electronically Signed   By: Dwyane Dee M.D.   On: 10/06/2017 09:14     Assessment & Plan:   Bilateral pleural effusion CXR today shows stable appearance   Patient Instructions  Chest xray today was stable from last check. Bilateral small pleural effusions unchanged from last scan.  Take an extra lasix (40 mg) in the am for the next 3 days - please follow  up with cardiology as soon as possible Continue all other medications Please follow up with Dr. Marchelle Gearing in 1 week or sooner if needed       Ivonne Andrew, NP 10/27/2017

## 2017-10-27 ENCOUNTER — Encounter: Payer: Self-pay | Admitting: Nurse Practitioner

## 2017-10-27 NOTE — Assessment & Plan Note (Signed)
CXR today shows stable appearance   Patient Instructions  Chest xray today was stable from last check. Bilateral small pleural effusions unchanged from last scan.  Take an extra lasix (40 mg) in the am for the next 3 days - please follow up with cardiology as soon as possible Continue all other medications Please follow up with Dr. Marchelle Gearing in 1 week or sooner if needed

## 2017-11-02 ENCOUNTER — Encounter: Payer: Self-pay | Admitting: Internal Medicine

## 2017-11-02 ENCOUNTER — Ambulatory Visit: Payer: Medicare Other | Admitting: Internal Medicine

## 2017-11-02 VITALS — BP 130/70 | Ht 73.0 in | Wt 201.0 lb

## 2017-11-02 DIAGNOSIS — J90 Pleural effusion, not elsewhere classified: Secondary | ICD-10-CM

## 2017-11-02 DIAGNOSIS — R0602 Shortness of breath: Secondary | ICD-10-CM

## 2017-11-02 MED ORDER — PREDNISONE 10 MG PO TABS: ORAL_TABLET | ORAL | 0 refills | Status: DC

## 2017-11-02 NOTE — Progress Notes (Signed)
OV 09/03/2017  Chief Complaint  Patient presents with  . Follow-up    Pt is here to follow up on recent thoracentesis he had done and also to find out on recent lab work. Pt stated after the thoracentesis, he had a stent taken care of last Wednesday and since then, pt states he is feeling better. Pt denies any current complaints of cough, SOB, or CP.   72 year old male with recurrent right pleural effusion. I last saw him in July 2019. Since then he's had 2 or 3 thoracentesis. 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 There was some lymphocytes but flow cytometry did not show any monoclonal population. He had a TSH autoimmune workup and cytology all negative or nondiagnostic. He's had CT chest and alsoabdominal ultrasound that did not show any cirrhosis other than slightly enlarged spleen. So at this point I recommended he stop his amlodipine which is known to cause pedal edema/fluid retention. He says he stopped this one week ago and is beginning to feel better. He says normally at this time he would be significantly dyspneic. He thinks the amlodipine might be the cause of his pleural effusion although review of the literature suggests that it is not known to be associated with pleural effusion. In the interim he's had a peripheral arterial disease stent removed from his left lower extremity artery not otherwise specified. And that is helping with his claudication.There is no chest pain or any other issues.   OV 10/05/2017  Subjective:  Patient ID: [name] Green, male , DOB: [birth year] , age 68 y.o. , MRN: [id number] , ADDRESS: [place] [id number]   10/05/2017 -   Chief Complaint  Patient presents with  . Follow-up    Pt states he has been doing good since last visit. states since the amlodopine was discontinued, he has been able to do a lot of activities that he was not able to do before. States his breathing has been  doing better than before.     HPI [name] Green 72 y.o. - has recurrent right greater than left bilateral pleural effusion. Significant problems in the right. They idiopathic and transudative. He has had multiple thoracentesis. Finally stopped amlodipine. He presents now for follow-up. He tells me that after stopping amlodipine he started feeling better. There is less fluid retention. His dyspnea is much improved. He is able to do a lot more activities. He still does have some dyspnea on exertionbut it is nowhere as significantly worse as it was. His last chest x-ray was over a month ago He does not want another chest x-ray today. He is seeing thoracic surgery tomorrow for routine follow-up and will have his chest x-ray that. He also deferred flu shot.   OV 10/27/17 - acute shortness of breath Patient presents today with shortness of breath x 3 days. States that symptoms are slightly worsening. He is concerned that his pleural effusions have worsened again. He is having a harder time with ADL's. He denies any chest pain or fever. Denies cough. He has been compliant with current medications.    OV 11/02/2017  Subjective:  Patient ID: [name] Green, male , DOB: 1945/10/15 , age 87 y.o. , MRN: [id number] , ADDRESS: [place] [id number]   11/02/2017 -   Chief Complaint  Patient presents with  . Follow-up    states his breathing continues at his baseline, no  new concerns. Needs clarification on Lasix.      HPI [name] Green 72 y.o. -returns for follow-up of his pleural effusions right greater than left.  He still tells me that after stopping amlodipine he is significantly better but is still some residual dyspnea.  1 week ago he saw my nurse practitioner for some worsening although he himself states he was not getting worse.  But clearly the visit was for worsening.  Increase his Lasix.  He feels back to baseline.  He feels his effusions might not be present but chest x-ray  from 1 week ago that I personally visualized is the same since August 2019.  Nevertheless after stopping amlodipine he is not having had any more thoracentesis.  And this is a significant improvement.  However the still residual pleural effusion.  He really wants to try an active course of prednisone because apparently this helped him in the past.  He refused to have a flu shot     ROS - per HPI     has a past medical history of A-fib ([place]), CAD, multiple vessel, Cardiomyopathy ([place]), Dysrhythmia, Hyperlipidemia, Hypertension, Mitral regurgitation (07/30/2016), PAD (peripheral artery disease) ([place]), and S/P cardiac cath (07/30/2016).   reports that he quit smoking about 18 years ago. His smoking use included cigarettes. He has a 55.00 pack-year smoking history. He has never used smokeless tobacco.  Past Surgical History:  Procedure Laterality Date  . CARDIAC CATHETERIZATION    . CORONARY ARTERY BYPASS GRAFT N/A 09/17/2016   Procedure: CORONARY ARTERY BYPASS GRAFTING (CABG), ON PUMP, TIMES THREE, USING LEFT INTERNAL MAMMARY ARTERY AND ENDOSCOPICALLY HARVESTED LEFT GREATER SAPHENOUS VEIN;  Surgeon: Melrose Nakayama, MD;  Location: [facility];  Service: Open Heart Surgery;  Laterality: N/A;  LIMA-LAD SVG-PD SVG-DIAG  . HERNIA REPAIR    . IR THORACENTESIS ASP PLEURAL SPACE W/IMG GUIDE  08/20/2017  . IR THORACENTESIS ASP PLEURAL SPACE W/IMG GUIDE  08/28/2017  . MAZE N/A 09/17/2016   Procedure: MAZE;  Surgeon: Melrose Nakayama, MD;  Location: [facility];  Service: Open Heart Surgery;  Laterality: N/A;  . MITRAL VALVE REPAIR N/A 09/17/2016   Procedure: MITRAL VALVE REPAIR (MVR);  Surgeon: Melrose Nakayama, MD;  Location: [facility];  Service: Open Heart Surgery;  Laterality: N/A;  Using Size 30 Physio II Annuloplasty Ring  . REPAIR QUADRICEPS / HAMSTRING MUSCLE Left   . TEE WITHOUT CARDIOVERSION N/A 09/17/2016   Procedure: TRANSESOPHAGEAL ECHOCARDIOGRAM (TEE);  Surgeon: Melrose Nakayama, MD;   Location: [facility];  Service: Open Heart Surgery;  Laterality: N/A;    No Known Allergies  Immunization History  Administered Date(s) Administered  . Pneumococcal Polysaccharide-23 07/11/2016    No family history on file.   Current Outpatient Medications:  .  apixaban (ELIQUIS) 5 MG TABS tablet, Take 5 mg by mouth daily. , Disp: , Rfl:  .  atorvastatin (LIPITOR) 40 MG tablet, Take 40 mg by mouth daily., Disp: , Rfl:  .  furosemide (LASIX) 40 MG tablet, Take 1 tablet (40 mg total) by mouth 2 (two) times daily., Disp: 60 tablet, Rfl: 5 .  KRILL OIL PO, Take 1,500 mg by mouth daily. , Disp: , Rfl:  .  lisinopril (PRINIVIL,ZESTRIL) 10 MG tablet, Take 2 tablets (20 mg total) by mouth daily., Disp: 30 tablet, Rfl: 6 .  metoprolol succinate (TOPROL-XL) 50 MG 24 hr tablet, Take 50 mg by mouth daily. Take with or immediately following a meal., Disp: , Rfl:  .  potassium  chloride SA (K-DUR,KLOR-CON) 20 MEQ tablet, Take 1 tablet (20 mEq total) by mouth 2 (two) times daily., Disp: 60 tablet, Rfl: 5 .  rOPINIRole (REQUIP) 2 MG tablet, 2 mg. MAY TAKE UP TO 5 PER DAY, Disp: , Rfl:  .  predniSONE (DELTASONE) 10 MG tablet, Take 22m x 7 days, then 395mx 7 days, then 2073m 7 days, then take 62m20mly and HOLD., Disp: 73 tablet, Rfl: 0      Objective:   Vitals:   11/02/17 0926  BP: 130/70  Weight: 201 lb (91.2 kg)  Height: 6' 1" (1.854 m)    Estimated body mass index is 26.52 kg/m as calculated from the following:   Height as of this encounter: 6' 1" (1.854 m).   Weight as of this encounter: 201 lb (91.2 kg).  _0 @  Filed Weights   11/02/17 0926  Weight: 201 lb (91.2 kg)     Physical Exam  General Appearance:    Alert, cooperative, no distress, appears stated age - yes , Deconditioned looking - no , OBESE  - no, Sitting on Wheelchair -  no  Head:    Normocephalic, without obvious abnormality, atraumatic  Eyes:    PERRL, conjunctiva/corneas clear,  Ears:    Normal TM's and  external ear canals, both ears  Nose:   Nares normal, septum midline, mucosa normal, no drainage    or sinus tenderness. OXYGEN ON  - no . Patient is @ ra   Throat:   Lips, mucosa, and tongue normal; teeth and gums normal. Cyanosis on lips - no  Neck:   Supple, symmetrical, trachea midline, no adenopathy;    thyroid:  no enlargement/tenderness/nodules; no carotid   bruit or JVD  Back:     Symmetric, no curvature, ROM normal, no CVA tenderness  Lungs:     Distress - no , Wheeze no, Barrell Chest - no, Purse lip breathing - no, Crackles - no -I thought air entry is equal on both sides  Chest Wall:    No tenderness or deformity.    Heart:    Regular rate and rhythm, S1 and S2 normal, no rub   or gallop, Murmur - no  Breast Exam:    NOT DONE  Abdomen:     Soft, non-tender, bowel sounds active all four quadrants,    no masses, no organomegaly. Visceral obesity - no  Genitalia:   NOT DONE  Rectal:   NOT DONE  Extremities:   Extremities - normal, Has Cane - no, Clubbing - no, Edema - no  Pulses:   2+ and symmetric all extremities  Skin:   Stigmata of Connective Tissue Disease - no  Lymph nodes:   Cervical, supraclavicular, and axillary nodes normal  Psychiatric:  Neurologic:   Pleasant - yes, Anxious - no, Flat affect - no  CAm-ICU - neg, Alert and Oriented x 3 - yes, Moves all 4s - yes, Speech - normal, Cognition - intact           Assessment:       ICD-10-CM   1. Pleural effusion J90 predniSONE (DELTASONE) 10 MG tablet    DG Chest 2 View  2. Shortness of breath R06.02 predniSONE (DELTASONE) 10 MG tablet    DG Chest 2 View       Plan:     Patient Instructions     ICD-10-CM   1. Pleural effusion J90   2. Shortness of breath R06.02    There is still residual  fluid Right > left - small amount unchagned since august  - you just had a cxr lst week ; so no need for another one  Plan - based on discussion and shared decision making  Take prednisone 40 mg daily x 7 days, then  30mg daily x 7 days, then 20mg daily x 7 days, then 10mg daily to continue  Continue lasix at 40mg twice daily  Followup 4-6 weeks with cxr Return sooner if needed     SIGNATURE    Dr. Murali Ramaswamy, M.D., F.C.C.P,  Pulmonary and Critical Care Medicine Staff Physician, [facility] Center Director - Interstitial Lung Disease  Program  [facility] [place], [place], [address]  Pager: [phone], If no answer or between  15:00h - 7:00h: call [phone] Telephone: [phone]  9:49 AM 11/02/2017  

## 2017-11-02 NOTE — Patient Instructions (Addendum)
ICD-10-CM   1. Pleural effusion J90   2. Shortness of breath R06.02    There is still residual fluid Right > left - small amount unchagned since august  - you just had a cxr lst week ; so no need for another one  Plan - based on discussion and shared decision making  Take prednisone 40 mg daily x 7 days, then 46m daily x 7 days, then 233mdaily x 7 days, then 1050maily to continue  Continue lasix at 65m65mice daily  Followup 4-6 weeks with cxr Return sooner if needed

## 2017-11-10 ENCOUNTER — Other Ambulatory Visit: Payer: Self-pay | Admitting: Thoracic Surgery (Cardiothoracic Vascular Surgery)

## 2017-11-27 ENCOUNTER — Other Ambulatory Visit: Payer: Self-pay | Admitting: Internal Medicine

## 2017-11-27 DIAGNOSIS — R0602 Shortness of breath: Secondary | ICD-10-CM

## 2017-11-27 DIAGNOSIS — J90 Pleural effusion, not elsewhere classified: Secondary | ICD-10-CM

## 2017-11-30 ENCOUNTER — Other Ambulatory Visit: Payer: Self-pay | Admitting: Thoracic Surgery (Cardiothoracic Vascular Surgery)

## 2017-12-04 ENCOUNTER — Other Ambulatory Visit: Payer: Self-pay | Admitting: Thoracic Surgery (Cardiothoracic Vascular Surgery)

## 2017-12-21 ENCOUNTER — Ambulatory Visit: Payer: Medicare Other | Admitting: Internal Medicine

## 2018-01-19 ENCOUNTER — Other Ambulatory Visit: Payer: Self-pay | Admitting: *Deleted

## 2018-01-19 DIAGNOSIS — R0602 Shortness of breath: Secondary | ICD-10-CM

## 2018-01-19 DIAGNOSIS — J90 Pleural effusion, not elsewhere classified: Secondary | ICD-10-CM

## 2018-01-20 ENCOUNTER — Encounter: Payer: Self-pay | Admitting: Internal Medicine

## 2018-01-20 ENCOUNTER — Ambulatory Visit: Payer: Medicare Other | Admitting: Internal Medicine

## 2018-01-20 ENCOUNTER — Ambulatory Visit (INDEPENDENT_AMBULATORY_CARE_PROVIDER_SITE_OTHER)
Admission: RE | Admit: 2018-01-20 | Discharge: 2018-01-20 | Disposition: A | Discharge status: Home / Self Care | Payer: Medicare Other | Source: Ambulatory Visit | Attending: Internal Medicine | Admitting: Internal Medicine

## 2018-01-20 VITALS — BP 128/70 | HR 82 | Ht 73.0 in | Wt 203.8 lb

## 2018-01-20 DIAGNOSIS — R0602 Shortness of breath: Secondary | ICD-10-CM | POA: Diagnosis not present

## 2018-01-20 DIAGNOSIS — J90 Pleural effusion, not elsewhere classified: Secondary | ICD-10-CM

## 2018-01-20 MED ORDER — PREDNISONE 1 MG PO TABS: ORAL_TABLET | ORAL | 2 refills | Status: DC

## 2018-01-20 NOTE — Patient Instructions (Addendum)
ICD-10-CM   1. Pleural effusion J90    Improved but ongoing very small residual right pleural  Effusion  Plan - reduce prednisone to 5mg  per day x 1 month and then 2mg  per day x 1 month and then 1 mg per day x 1 month and then 1mg  every other day x 1 month and stop  Followup 6-9 months or sooner if needed

## 2018-01-20 NOTE — Progress Notes (Signed)
OV 09/03/2017  Chief Complaint  Patient presents with  . Follow-up    Pt is here to follow up on recent thoracentesis he had done and also to find out on recent lab work. Pt stated after the thoracentesis, he had a stent taken care of last Wednesday and since then, pt states he is feeling better. Pt denies any current complaints of cough, SOB, or CP.   73 year old male with recurrent right pleural effusion. I last saw him in July 2019. Since then he's had 2 or 3 thoracentesis. 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 There was some lymphocytes but flow cytometry did not show any monoclonal population. He had a TSH autoimmune workup and cytology all negative or nondiagnostic. He's had CT chest and alsoabdominal ultrasound that did not show any cirrhosis other than slightly enlarged spleen. So at this point I recommended he stop his amlodipine which is known to cause pedal edema/fluid retention. He says he stopped this one week ago and is beginning to feel better. He says normally at this time he would be significantly dyspneic. He thinks the amlodipine might be the cause of his pleural effusion although review of the literature suggests that it is not known to be associated with pleural effusion. In the interim he's had a peripheral arterial disease stent removed from his left lower extremity artery not otherwise specified. And that is helping with his claudication.There is no chest pain or any other issues.   OV 10/05/2017  Subjective:  Patient ID: DEFOREST MAIDEN, male , DOB: [birth year] , age 68 y.o. , MRN: [id number] , ADDRESS: [place] [id number]   10/05/2017 -   Chief Complaint  Patient presents with  . Follow-up    Pt states he has been doing good since last visit. states since the amlodopine was discontinued, he has been able to do a lot of activities that he was not able to do before. States his breathing has been  doing better than before.     HPI OVADIA LOPP 73 y.o. - has recurrent right greater than left bilateral pleural effusion. Significant problems in the right. They idiopathic and transudative. He has had multiple thoracentesis. Finally stopped amlodipine. He presents now for follow-up. He tells me that after stopping amlodipine he started feeling better. There is less fluid retention. His dyspnea is much improved. He is able to do a lot more activities. He still does have some dyspnea on exertionbut it is nowhere as significantly worse as it was. His last chest x-ray was over a month ago He does not want another chest x-ray today. He is seeing thoracic surgery tomorrow for routine follow-up and will have his chest x-ray that. He also deferred flu shot.   OV 10/27/17 - acute shortness of breath Patient presents today with shortness of breath x 3 days. States that symptoms are slightly worsening. He is concerned that his pleural effusions have worsened again. He is having a harder time with ADL's. He denies any chest pain or fever. Denies cough. He has been compliant with current medications.    OV 11/02/2017  Subjective:  Patient ID: RAINER MOUNCE, male , DOB: 1945/10/15 , age 87 y.o. , MRN: [id number] , ADDRESS: [place] [address]   11/02/2017 -   Chief Complaint  Patient presents with  . Follow-up    states his breathing continues at his baseline, no  new concerns. Needs clarification on Lasix.      HPI RESHARD KITTELSON 73 y.o. -returns for follow-up of his pleural effusions right greater than left.  He still tells me that after stopping amlodipine he is significantly better but is still some residual dyspnea.  1 week ago he saw my nurse practitioner for some worsening although he himself states he was not getting worse.  But clearly the visit was for worsening.  Increase his Lasix.  He feels back to baseline.  He feels his effusions might not be present but chest x-ray  from 1 week ago that I personally visualized is the same since August 2019.  Nevertheless after stopping amlodipine he is not having had any more thoracentesis.  And this is a significant improvement.  However the still residual pleural effusion.  He really wants to try an active course of prednisone because apparently this helped him in the past.  He refused to have a flu shot  OV 01/20/2018  Subjective:  Patient ID: KEPLER GIANINO, male , DOB: August 26, 1945 , age 59 y.o. , MRN: [id number] , ADDRESS: [address] [place] [address]   01/20/2018 -   Chief Complaint  Patient presents with  . Follow-up    Cxr performed today.  Pt states he has been doing well since last visit and denies any complaints.     HPI PRESLEY GOOSMAN 73 y.o. -presents for follow-up of his pleural effusion.  After visit November 02, 2017 at his request we placed him on prednisone.  He is not taking chronic prednisone since then current dosages 10 mg/day.  He feels the prednisone is helping him.  However he does understand that long-term consequences of prednisone are negative.  He is willing to slowly taper this off.  He feels his dyspnea is stable he again refused flu shot.  He had a chest x-ray today that shows only small pleural effusion remaining on the right side.  I personally visualized this result.  There are no other new issues other than some weight gain due to prednisone.   cxr personally visualized Dg Chest 2 View  Result Date: 01/20/2018 CLINICAL DATA:  Preop.  Hernia surgery. EXAM: CHEST - 2 VIEW COMPARISON:  10/26/2017 FINDINGS: Left subclavian AICD device is stable. Mitral valve hardware is stable in position. The heart is normal in size. Small pleural effusions right greater than left are improved. Hazy airspace disease at the lung bases has improved. Upper lungs clear. No pneumothorax. IMPRESSION: Improved small pleural effusions right greater than left. Improved bibasilar hazy airspace disease.  Electronically Signed   By: Jolaine Click M.D.   On: 01/20/2018 09:20     ROS - per HPI     has a past medical history of A-fib ([facility]), CAD, multiple vessel, Cardiomyopathy ([facility]), Dysrhythmia, Hyperlipidemia, Hypertension, Mitral regurgitation (07/30/2016), PAD (peripheral artery disease) ([facility]), and S/P cardiac cath (07/30/2016).   reports that he quit smoking about 19 years ago. His smoking use included cigarettes. He has a 55.00 pack-year smoking history. He has never used smokeless tobacco.  Past Surgical History:  Procedure Laterality Date  . CARDIAC CATHETERIZATION    . CORONARY ARTERY BYPASS GRAFT N/A 09/17/2016   Procedure: CORONARY ARTERY BYPASS GRAFTING (CABG), ON PUMP, TIMES THREE, USING LEFT INTERNAL MAMMARY ARTERY AND ENDOSCOPICALLY HARVESTED LEFT GREATER SAPHENOUS VEIN;  Surgeon: Loreli Slot, MD;  [facility];  Service: Open Heart Surgery;  Laterality: N/A;  LIMA-LAD SVG-PD SVG-DIAG  . HERNIA REPAIR    .  IR THORACENTESIS ASP PLEURAL SPACE W/IMG GUIDE  08/20/2017  . IR THORACENTESIS ASP PLEURAL SPACE W/IMG GUIDE  08/28/2017  . MAZE N/A 09/17/2016   Procedure: MAZE;  Surgeon: Loreli SlotHendrickson, Steven C, MD;  [facility];  Service: Open Heart Surgery;  Laterality: N/A;  . MITRAL VALVE REPAIR N/A 09/17/2016   Procedure: MITRAL VALVE REPAIR (MVR);  Surgeon: Loreli SlotHendrickson, Steven C, MD;  [facility];  Service: Open Heart Surgery;  Laterality: N/A;  Using Size 30 Physio II Annuloplasty Ring  . REPAIR QUADRICEPS / HAMSTRING MUSCLE Left   . TEE WITHOUT CARDIOVERSION N/A 09/17/2016   Procedure: TRANSESOPHAGEAL ECHOCARDIOGRAM (TEE);  Surgeon: Loreli SlotHendrickson, Steven C, MD;  [facility];  Service: Open Heart Surgery;  Laterality: N/A;    No Known Allergies  Immunization History  Administered Date(s) Administered  . Pneumococcal Polysaccharide-23 07/11/2016    No family history on file.   Current Outpatient Medications:  .  apixaban (ELIQUIS) 5 MG TABS tablet, Take 5 mg  by mouth daily. , Disp: , Rfl:  .  atorvastatin (LIPITOR) 40 MG tablet, Take 40 mg by mouth daily., Disp: , Rfl:  .  furosemide (LASIX) 40 MG tablet, Take 1 tablet (40 mg total) by mouth 2 (two) times daily., Disp: 60 tablet, Rfl: 5 .  KRILL OIL PO, Take 1,500 mg by mouth daily. , Disp: , Rfl:  .  lisinopril (PRINIVIL,ZESTRIL) 10 MG tablet, Take 2 tablets (20 mg total) by mouth daily., Disp: 30 tablet, Rfl: 6 .  metoprolol succinate (TOPROL-XL) 50 MG 24 hr tablet, Take 50 mg by mouth daily. Take with or immediately following a meal., Disp: , Rfl:  .  potassium chloride SA (K-DUR,KLOR-CON) 20 MEQ tablet, Take 1 tablet (20 mEq total) by mouth 2 (two) times daily., Disp: 60 tablet, Rfl: 5 .  rOPINIRole (REQUIP) 2 MG tablet, 2 mg. MAY TAKE UP TO 5 PER DAY, Disp: , Rfl:  .  predniSONE (DELTASONE) 1 MG tablet, Take 5tabsdailyx7026month, then 2tabsdailyx2426month, then 1tabdailyx1226month, then 1tab every other dayx4026month, then stop, Disp: 200 tablet, Rfl: 2      Objective:   Vitals:   01/20/18 0905  BP: 128/70  Pulse: 82  SpO2: 99%  Weight: 203 lb 12.8 oz (92.4 kg)  Height: 6\' 1"  (1.854 m)    Estimated body mass index is 26.89 kg/m as calculated from the following:   Height as of this encounter: 6\' 1"  (1.854 m).   Weight as of this encounter: 203 lb 12.8 oz (92.4 kg).  @WEIGHTCHANGE @  [employer]iled Weights   01/20/18 0905  Weight: 203 lb 12.8 oz (92.4 kg)     Physical Exam  General Appearance:    Alert, cooperative, no distress, appears stated age - yes , Deconditioned looking - no , OBESE  - no, Sitting on Wheelchair -  no  Head:    Normocephalic, without obvious abnormality, atraumatic  Eyes:    PERRL, conjunctiva/corneas clear,  Ears:    Normal TM's and external ear canals, both ears  Nose:   Nares normal, septum midline, mucosa normal, no drainage    or sinus tenderness. OXYGEN ON  - no . Patient is @ ra   Throat:   Lips, mucosa, and tongue normal; teeth and gums normal. Cyanosis on lips - no   Neck:   Supple, symmetrical, trachea midline, no adenopathy;    thyroid:  no enlargement/tenderness/nodules; no carotid   bruit or JVD  Back:     Symmetric, no curvature, ROM normal, no CVA  tenderness  Lungs:     Distress - no , Wheeze no, Barrell Chest - no, Purse lip breathing - no, Crackles - no . RT SIDE AIR ENTRY DIMINISHED - at LUNG BASE  Chest Wall:    No tenderness or deformity.    Heart:    Regular rate and rhythm, S1 and S2 normal, no rub   or gallop, Murmur - no  Breast Exam:    NOT DONE  Abdomen:     Soft, non-tender, bowel sounds active all four quadrants,    no masses, no organomegaly. Visceral obesity - no  Genitalia:   NOT DONE  Rectal:   NOT DONE  Extremities:   Extremities - normal, Has Cane - no, Clubbing - no, Edema - no  Pulses:   2+ and symmetric all extremities  Skin:   Stigmata of Connective Tissue Disease - no  Lymph nodes:   Cervical, supraclavicular, and axillary nodes normal  Psychiatric:  Neurologic:   Pleasant - yes, Anxious - no, Flat affect - no  CAm-ICU - neg, Alert and Oriented x 3 - yes, Moves all 4s - yes, Speech - normal, Cognition - intact           Assessment:       ICD-10-CM   1. Pleural effusion J90        Plan:     Patient Instructions     ICD-10-CM   1. Pleural effusion J90    Improved but ongoing very small residual right pleural  Effusion  Plan - reduce prednisone to 5mg  per day x 1 month and then 2mg  per day x 1 month and then 1 mg per day x 1 month and then 1mg  every other day x 1 month and stop  Followup 6-9 months or sooner if needed      SIGNATURE    Dr. Kalman Shan, M.D., F.C.C.P,  Pulmonary and Critical Care Medicine Staff Physician, [facility] Center Director - Interstitial Lung Disease  Program  Pulmonary Fibrosis [facility] at [facility] [place], [place], [address]  Pager: [phone], If no answer or between  15:00h - 7:00h: call [phone] Telephone: [phone]  10:26 AM 01/20/2018     SIGNATURE    Dr. Kalman Shan, M.D., F.C.C.P,  Pulmonary and Critical Care Medicine Staff Physician, [facility] Center Director - Interstitial Lung Disease  Program  Pulmonary Fibrosis [facility] Network at [facility] [place], [place], [address]  Pager: [phone], If no answer or between  15:00h - 7:00h: call [phone] Telephone: [phone]  10:26 AM 01/20/2018

## 2018-03-05 ENCOUNTER — Telehealth: Payer: Self-pay | Admitting: Internal Medicine

## 2018-03-05 NOTE — Telephone Encounter (Signed)
Called and spoke with pt in regards to prednisone Rx. Pt stated with the 200tablets that was called in, he does not have enough quantity left to finish the taper that MR was wanting him to do.  Stated to pt that he should have some refills of the prednisone and should be able to call pharmacy to go ahead and refill Rx. Stated to him once he gets the Rx to just continue with the number of tablets that he was at. Pt expressed understanding. Nothing further needed.

## 2018-03-10 ENCOUNTER — Telehealth: Payer: Self-pay

## 2018-03-10 ENCOUNTER — Telehealth: Payer: Self-pay | Admitting: Internal Medicine

## 2018-03-10 NOTE — Telephone Encounter (Signed)
I have looked at the most recent x ray. I want to see the patient first and then I will decide if another xray is indicated. Thanks.

## 2018-03-10 NOTE — Telephone Encounter (Signed)
MR disregard this message, patient has an appt 03/02 with Buelah Manis. Thanks. Nothing further is needed at this time.

## 2018-03-10 NOTE — Telephone Encounter (Signed)
Nothing further is needed at this time.  

## 2018-03-10 NOTE — Telephone Encounter (Signed)
Spoke with pt.  Made appt for 03/11/2018 930am with Tonya.  X-ray is in pacs system from 03/08/2018.  Nothing further is needed.

## 2018-03-10 NOTE — Telephone Encounter (Signed)
Called and spoke with pt stating to him that [place] stated not to have a cxr prior to OV that she would evaluate him prior and then decide if a cxr needs to be done. Pt expressed understanding.  Stated to pt that we would see him tomorrow, 2/27 for the OV but then pt stated that that was supposed to be changed due to him having a meeting tomorrow and he thought appt had been changed for Monday, 3/2 at 9:30. Stated to pt that they had not changed the date but I would do that for him.  appt was changed with TN Monday at 9:30. Nothing further needed.

## 2018-03-10 NOTE — Telephone Encounter (Signed)
Patient has an appt in the AM with you, please advise if patient will need a repeat CXR. Last CXR 03/08/18 available in PACS system.  TN please advise. Thanks.

## 2018-03-10 NOTE — Telephone Encounter (Signed)
Call made to patient, he states he wanted to know if he should get a Chest Xray before his next appt on 03/15/18. I informed patient per the MD's last AVS a chest xray was not requested but if he wanted I would ask MR.   MR please advise if patient can have Chest X-ray at next visit. If so please give [place] the DX. Thanks.

## 2018-03-11 ENCOUNTER — Ambulatory Visit: Payer: Medicare Other | Admitting: Nurse Practitioner

## 2018-03-15 ENCOUNTER — Encounter: Payer: Self-pay | Admitting: Nurse Practitioner

## 2018-03-15 ENCOUNTER — Ambulatory Visit: Payer: Medicare Other | Admitting: Nurse Practitioner

## 2018-03-15 DIAGNOSIS — J90 Pleural effusion, not elsewhere classified: Secondary | ICD-10-CM | POA: Diagnosis not present

## 2018-03-15 NOTE — Patient Instructions (Signed)
Stable but ongoing small residual right pleural  Effusion  Plan - continue to reduce prednisone currently at 2mg  per day x 1 month and then 1 mg per day x 1 month and then 1mg  every other day x 1 month and stop  Followup 3 months with Dr. Marchelle Gearing or sooner if needed

## 2018-03-15 NOTE — Assessment & Plan Note (Addendum)
Patient presents today for follow-up on pleural effusion after seeing PCP recently.  I reviewed his recent chest x-rays today.  His right pleural effusion is slightly increased since last x-ray.  Patient is doing well and is asymptomatic.  No recent fevers or shortness of breath.  He states that he feels well.  He is currently tapering down prednisone dosage doing well with taper.  Patient Instructions  Stable but ongoing small residual right pleural  Effusion  Plan - continue to reduce prednisone currently at 2mg  per day x 1 month and then 1 mg per day x 1 month and then 1mg  every other day x 1 month and stop  Followup 3 months with Dr. Marchelle Gearing or sooner if needed

## 2018-03-15 NOTE — Progress Notes (Signed)
@Patient  ID: [name] Green, male    DOB: Oct 27, 1945, 73 y.o.   MRN: [id number]  Chief Complaint  Patient presents with  . Pleural Effusion    Referring provider: Laqueta Due., MD  HPI  73 year old male with bilateral pleural effusions more significant in the right with a history of multiple thoracentesis. He has stopped amlodipine and symptoms have greatly improved. He is followed by Dr. Marchelle Gearing.  Health history includes CHF, PAD, HTN, S/P CABG X 3, S/P MAZE operation for a-fib.   Tests/significant events: CXR 01/20/18 - Improved small pleural effusions right greater than left. Improved bibasilar hazy airspace disease.  CXR 10/27/18 - Stable chest radiograph findings. Persistent bilateral pleural effusions, right side greater than left. Right pleural effusion is small to moderate in size. Stable prominent interstitial lung markings that could represent chronic changes versus edema. Favor chronic changes based on the comparison studies.  PFT Results Latest Ref Rng & Units 09/12/2016  FVC-Pre L 2.98  FVC-Predicted Pre % 61  FVC-Post L 3.03  FVC-Predicted Post % 62  Pre FEV1/FVC % % 62  Post FEV1/FCV % % 61  FEV1-Pre L 1.85  FEV1-Predicted Pre % 52  FEV1-Post L 1.85  DLCO UNC% % 49  DLCO COR %Predicted % 74  TLC L 6.53  TLC % Predicted % 85  RV % Predicted % 131   SIX MIN WALK 08/05/2017  Supplimental Oxygen during Test? (L/min) No  Tech Comments: steady walk without complications, no desat TA/CMA    Last OV Dr. Marchelle Gearing 01/20/18 [name] Green 73 y.o. -presents for follow-up of his pleural effusion.  After visit November 02, 2017 at his request we placed him on prednisone.  He is not taking chronic prednisone since then current dosages 10 mg/day.  He feels the prednisone is helping him.  However he does understand that long-term consequences of prednisone are negative.  He is willing to slowly taper this off.  He feels his dyspnea is stable he again refused flu shot.  He had a  chest x-ray today that shows only small pleural effusion remaining on the right side.  I personally visualized this result.  There are no other new issues other than some weight gain due to prednisone. Plan: Improved but ongoing very small residual right pleural  Effusion - reduce prednisone to 5mg  per day x 1 month and then 2mg  per day x 1 month and then 1 mg per day x 1 month and then 1mg  every other day x 1 month and stop Followup 6-9 months or sooner if needed   OV 03/15/18 - Follow up pleural per PCP Patient presents today for follow-up on pleural effusion.  States that overall he has been doing well.  He was seen by his PCP the end of January and was diagnosed with pneumonia.  He was treated with antibiotic and symptoms did improve.  His follow-up visit with PCP his chest x-ray showed slightly increased pleural effusion on the right side.  His PCP advised him to follow-up with our office.  Patient states he has been doing well since antibiotic treatment.  His cough has cleared.  He denies any shortness of breath.  He denies any recent fever.  He is active and can complete activities of daily living without any assistance.  He denies any recent chest pain or edema.  Patient is compliant with prednisone taper as ordered at last visit with Dr. Marchelle Gearing.  He is now weaned denies down to 2  tablets daily x1 month we will continue to taper his prednisone.   No Known Allergies  Immunization History  Administered Date(s) Administered  . Pneumococcal Polysaccharide-23 07/11/2016    Past Medical History:  Diagnosis Date  . A-fib (HCC)    persistent  . CAD, multiple vessel   . Cardiomyopathy (HCC)   . Dysrhythmia    afib  . Hyperlipidemia   . Hypertension   . Mitral regurgitation 07/30/2016   ECHO  . PAD (peripheral artery disease) (HCC)    FOLLOWED BY DR. Sondra Come, ON PLAVIX  . S/P cardiac cath 07/30/2016    Tobacco History: Social History   Tobacco Use  Smoking Status Former Smoker  .  Packs/day: 1.00  . Years: 55.00  . Pack years: 55.00  . Types: Cigarettes  . Last attempt to quit: 01/14/1999  . Years since quitting: 19.1  Smokeless Tobacco Never Used   Counseling given: Yes   Outpatient Encounter Medications as of 03/15/2018  Medication Sig  . apixaban (ELIQUIS) 5 MG TABS tablet Take 5 mg by mouth daily.   Marland Kitchen atorvastatin (LIPITOR) 40 MG tablet Take 40 mg by mouth daily.  . furosemide (LASIX) 40 MG tablet Take 1 tablet (40 mg total) by mouth 2 (two) times daily.  Marland Kitchen KRILL OIL PO Take 1,500 mg by mouth daily.   Marland Kitchen lisinopril (PRINIVIL,ZESTRIL) 10 MG tablet Take 2 tablets (20 mg total) by mouth daily.  . metoprolol succinate (TOPROL-XL) 50 MG 24 hr tablet Take 50 mg by mouth daily. Take with or immediately following a meal.  . potassium chloride SA (K-DUR,KLOR-CON) 20 MEQ tablet Take 1 tablet (20 mEq total) by mouth 2 (two) times daily.  . predniSONE (DELTASONE) 1 MG tablet Take 5tabsdailyx58month, then 2tabsdailyx32month, then 1tabdailyx43month, then 1tab every other dayx40month, then stop  . rOPINIRole (REQUIP) 2 MG tablet 2 mg. MAY TAKE UP TO 5 PER DAY   No facility-administered encounter medications on file as of 03/15/2018.      Review of Systems  Review of Systems  Constitutional: Negative for activity change, chills, fatigue and fever.  HENT: Negative.   Respiratory: Negative for cough, shortness of breath and wheezing.   Cardiovascular: Negative.  Negative for chest pain, palpitations and leg swelling.  Gastrointestinal: Negative.   Allergic/Immunologic: Negative.   Neurological: Negative.   Psychiatric/Behavioral: Negative.        Physical Exam  BP 124/70 (BP Location: Right Arm, Patient Position: Sitting, Cuff Size: Normal)   Pulse 74   Ht  (1.854 m)   Wt 197 lb 3.2 oz (89.4 kg)   SpO2 100%   BMI 26.02 kg/m   Wt Readings from Last 5 Encounters:  03/15/18 197 lb 3.2 oz (89.4 kg)  01/20/18 203 lb 12.8 oz (92.4 kg)  11/02/17 201 lb (91.2 kg)    10/26/17 204 lb 1.6 oz (92.6 kg)  10/06/17 194 lb (88 kg)     Physical Exam Vitals signs and nursing note reviewed.  Constitutional:      General: He is not in acute distress.    Appearance: He is well-developed.  Cardiovascular:     Rate and Rhythm: Normal rate and regular rhythm.  Pulmonary:     Effort: Pulmonary effort is normal. No respiratory distress.     Breath sounds: Normal breath sounds. No wheezing or rhonchi.  Skin:    General: Skin is warm and dry.  Neurological:     Mental Status: He is alert and oriented to person, place, and time.  Assessment & Plan:   Bilateral pleural effusion Patient presents today for follow-up on pleural effusion after seeing PCP recently.  I reviewed his recent chest x-rays today.  His right pleural effusion is slightly increased since last x-ray.  Patient is doing well and is asymptomatic.  No recent fevers or shortness of breath.  He states that he feels well.  He is currently tapering down prednisone dosage doing well with taper.  Patient Instructions  Stable but ongoing small residual right pleural  Effusion  Plan - continue to reduce prednisone currently at 2mg  per day x 1 month and then 1 mg per day x 1 month and then 1mg  every other day x 1 month and stop  Followup 3 months with Dr. Marchelle Gearing or sooner if needed       Ivonne Andrew, NP 03/15/2018

## 2018-11-26 IMAGING — DX DG CHEST 1V PORT
1 series · 1 of 1 positions shown · non-contrast
Comparison: 09/12/2016

CLINICAL DATA: Coronary artery disease. Mitral valve disease.
Status post CABG. Status post mitral valve repair.

EXAM:
PORTABLE CHEST 1 VIEW

[chest ap]
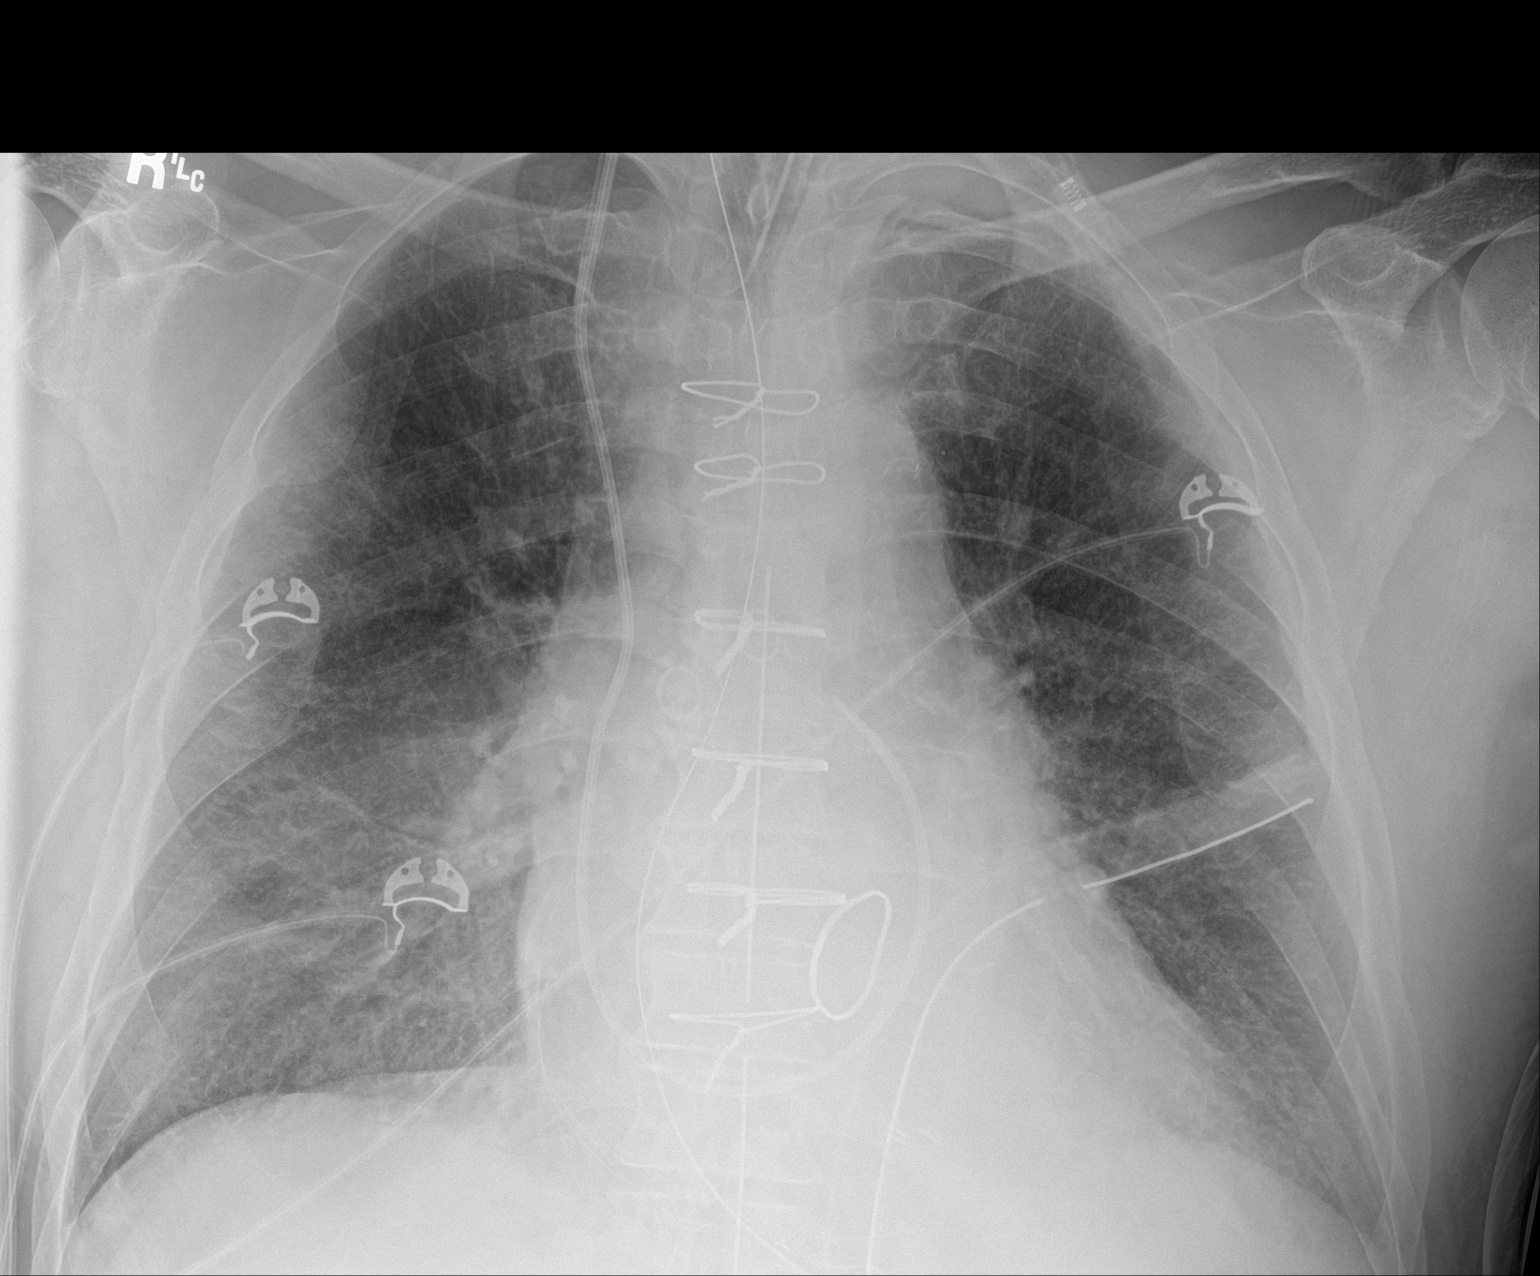

[1 of 1 positions shown; findings below may reference images not displayed]

FINDINGS: Endotracheal tube, chest tubes, Swan-Ganz catheter and NG tube all
appear in good position.

Tiny right apical pneumothorax.

Slight bilateral interstitial pulmonary edema. Overall heart size
and pulmonary vascularity are normal.
IMPRESSION: 1. Mild bilateral interstitial pulmonary edema.
2. Tiny right apical pneumothorax.

## 2018-11-27 IMAGING — DX DG CHEST 1V PORT
1 series · 1 of 1 positions shown · non-contrast
Comparison: One-view chest x-ray 09/17/2016.

CLINICAL DATA: Three vessel CABG and mitral valve repair yesterday.

EXAM:
PORTABLE CHEST 1 VIEW

[chest ap]
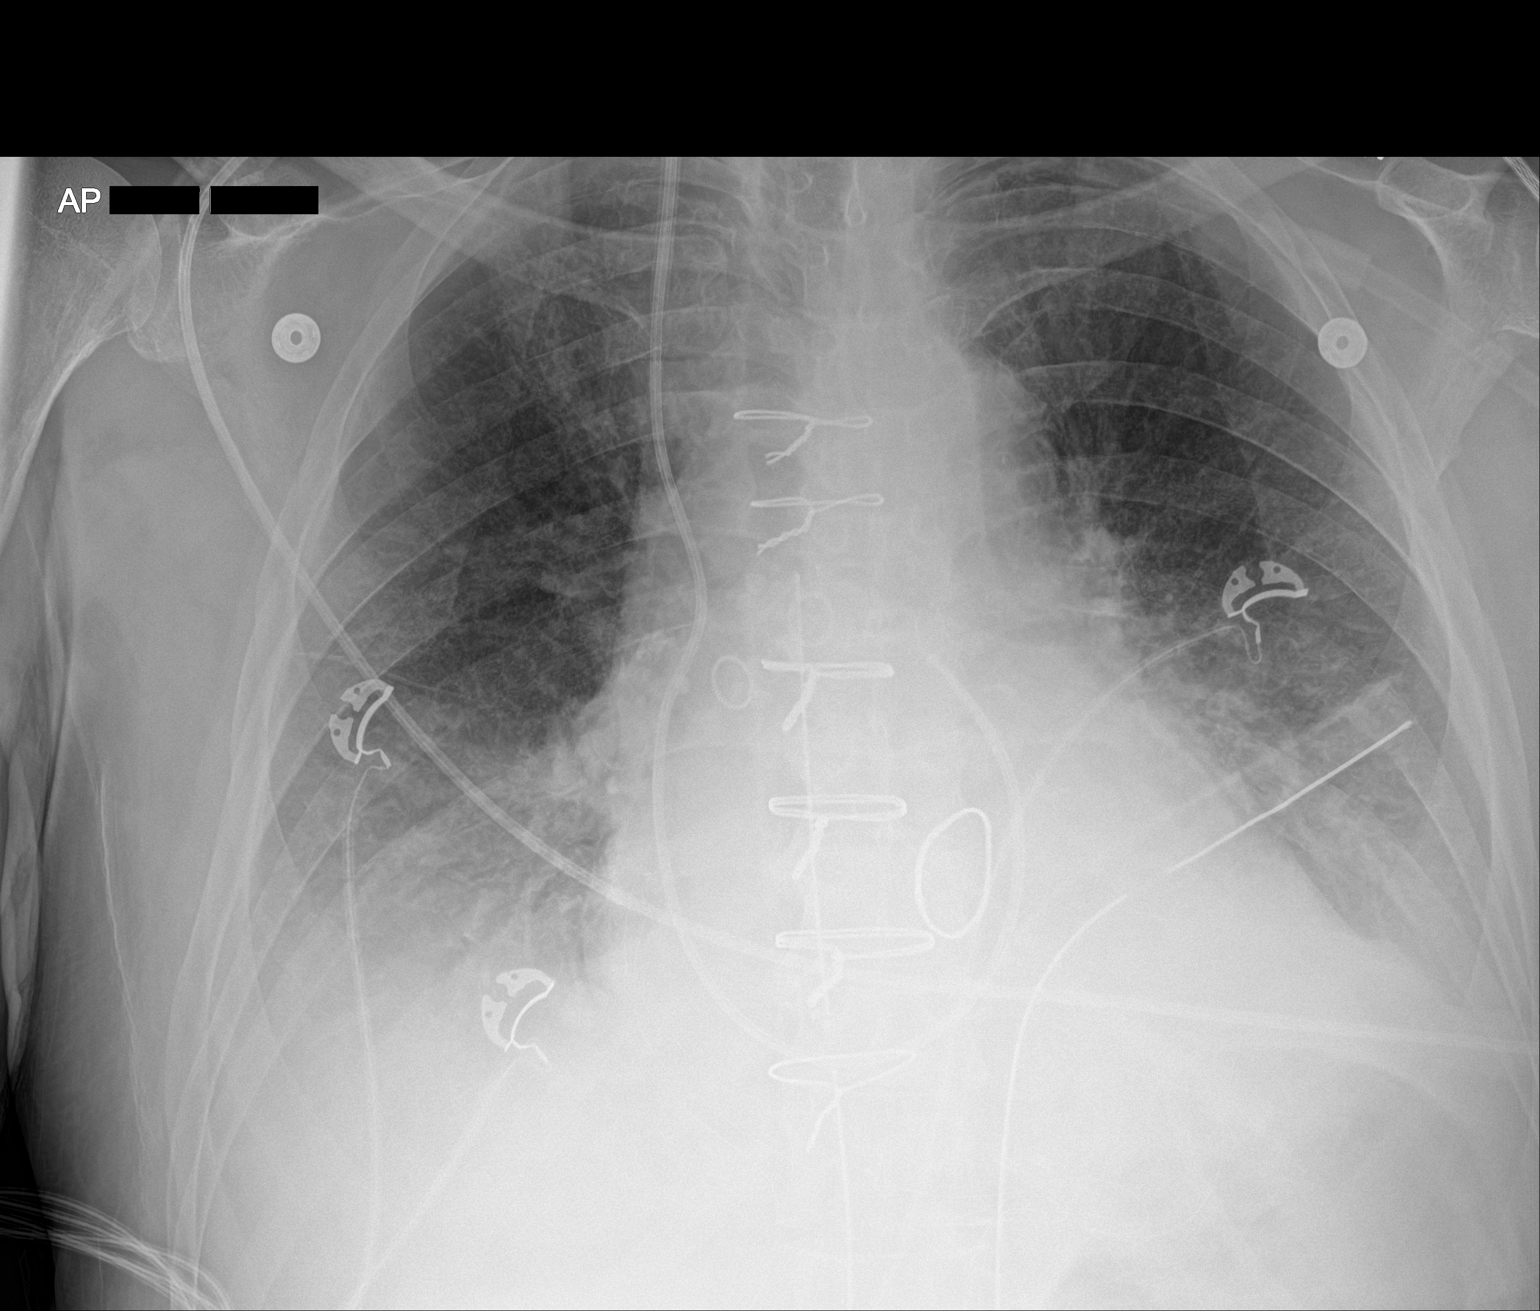

[1 of 1 positions shown; findings below may reference images not displayed]

FINDINGS: The patient has been extubated. A small right apical pneumothorax
remains. The tip of the Swan-Ganz catheter is in the main pulmonary
outflow tract. Mediastinal drain and left-sided chest tube are
present without a significant left-sided pneumothorax.

Lung volumes have decreased. Interstitial edema and bilateral
pleural effusions have increased. Bibasilar airspace disease likely
reflects atelectasis.
IMPRESSION: 1. Interval extubation.
2. Supportive apparatus is otherwise stable.
3. Stable small right apical pneumothorax.
4. Increasing edema and effusions with decreased lung volumes.
5. Bibasilar airspace disease likely reflects atelectasis.

## 2019-05-19 ENCOUNTER — Other Ambulatory Visit: Payer: Self-pay

## 2019-05-19 ENCOUNTER — Encounter: Payer: Self-pay | Admitting: Internal Medicine

## 2019-05-19 ENCOUNTER — Ambulatory Visit: Payer: Medicare Other | Admitting: Internal Medicine

## 2019-05-19 VITALS — BP 120/78 | HR 80 | Temp 97.2°F | Ht 73.0 in | Wt 192.0 lb

## 2019-05-19 DIAGNOSIS — R0602 Shortness of breath: Secondary | ICD-10-CM | POA: Diagnosis not present

## 2019-05-19 DIAGNOSIS — R0982 Postnasal drip: Secondary | ICD-10-CM

## 2019-05-19 DIAGNOSIS — J90 Pleural effusion, not elsewhere classified: Secondary | ICD-10-CM | POA: Diagnosis not present

## 2019-05-19 MED ORDER — FLUTICASONE PROPIONATE 50 MCG/ACT NA SUSP: 1.0000 | Freq: Every day | NASAL | 2 refills | Status: AC

## 2019-05-19 NOTE — Progress Notes (Signed)
OV 09/03/2017  Chief Complaint  Patient presents with  . Follow-up    Pt is here to follow up on recent thoracentesis he had done and also to find out on recent lab work. Pt stated after the thoracentesis, he had a stent taken care of last Wednesday and since then, pt states he is feeling better. Pt denies any current complaints of cough, SOB, or CP.   74 year old male with recurrent right pleural effusion. I last saw him in July 2019. Since then he's had 2 or 3 thoracentesis. 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 There was some lymphocytes but flow cytometry did not show any monoclonal population. He had a TSH autoimmune workup and cytology all negative or nondiagnostic. He's had CT chest and alsoabdominal ultrasound that did not show any cirrhosis other than slightly enlarged spleen. So at this point I recommended he stop his amlodipine which is known to cause pedal edema/fluid retention. He says he stopped this one week ago and is beginning to feel better. He says normally at this time he would be significantly dyspneic. He thinks the amlodipine might be the cause of his pleural effusion although review of the literature suggests that it is not known to be associated with pleural effusion. In the interim he's had a peripheral arterial disease stent removed from his left lower extremity artery not otherwise specified. And that is helping with his claudication.There is no chest pain or any other issues.   OV 10/05/2017  Subjective:  Patient ID: ESAIAH WANLESS, male , DOB: 1945/11/23 , age 63 y.o. , MRN: [id number] , ADDRESS: [address] [place] [address]   10/05/2017 -   Chief Complaint  Patient presents with  . Follow-up    Pt states he has been doing good since last visit. states since the amlodopine was discontinued, he has been able to do a lot of activities that he was not able to do before. States his breathing has been doing  better than before.     HPI JAEVON PARAS 74 y.o. - has recurrent right greater than left bilateral pleural effusion. Significant problems in the right. They idiopathic and transudative. He has had multiple thoracentesis. Finally stopped amlodipine. He presents now for follow-up. He tells me that after stopping amlodipine he started feeling better. There is less fluid retention. His dyspnea is much improved. He is able to do a lot more activities. He still does have some dyspnea on exertionbut it is nowhere as significantly worse as it was. His last chest x-ray was over a month ago He does not want another chest x-ray today. He is seeing thoracic surgery tomorrow for routine follow-up and will have his chest x-ray that. He also deferred flu shot.   OV 10/27/17 - acute shortness of breath Patient presents today with shortness of breath x 3 days. States that symptoms are slightly worsening. He is concerned that his pleural effusions have worsened again. He is having a harder time with ADL's. He denies any chest pain or fever. Denies cough. He has been compliant with current medications.    OV 11/02/2017  Subjective:  Patient ID: TYRESE CAPRIOTTI, male , DOB: 01/18/1945 , age 28 y.o. , MRN: [id number] , ADDRESS: [address] [place] [address]   11/02/2017 -   Chief Complaint  Patient presents with  . Follow-up    states his breathing continues at his baseline, no new concerns. Needs  clarification on Lasix.      HPI JABRIL PURSELL 74 y.o. -returns for follow-up of his pleural effusions right greater than left.  He still tells me that after stopping amlodipine he is significantly better but is still some residual dyspnea.  1 week ago he saw my nurse practitioner for some worsening although he himself states he was not getting worse.  But clearly the visit was for worsening.  Increase his Lasix.  He feels back to baseline.  He feels his effusions might not be present but chest x-ray from  1 week ago that I personally visualized is the same since August 2019.  Nevertheless after stopping amlodipine he is not having had any more thoracentesis.  And this is a significant improvement.  However the still residual pleural effusion.  He really wants to try an active course of prednisone because apparently this helped him in the past.  He refused to have a flu shot  OV 05/19/2019  Subjective:  Patient ID: HONDO NANDA, male , DOB: [birth year] , age 40 y.o. , MRN: [id number] , ADDRESS: [place] [address]   05/19/2019 -   Chief Complaint  Patient presents with  . Follow-up    Pt states he has been doing good since last visit and denies any complaints.     HPI JORAH HUA 74 y.o. -presents for follow-up of his pleural effusion. Overall he is doing well since his last visit. He has mild SOB occasionally, denies productive cough or sputum production. He has been outdoors a lot and sounds like he has upper airway congestion on exam. He has not tried any allergy medication and denies a history of allergies.      ROS - per HPI    has a past medical history of A-fib ([facility]), CAD, multiple vessel, Cardiomyopathy ([facility]), Dysrhythmia, Hyperlipidemia, Hypertension, Mitral regurgitation (07/30/2016), PAD (peripheral artery disease) ([facility]), and S/P cardiac cath (07/30/2016).   reports that he quit smoking about 20 years ago. His smoking use included cigarettes. He has a 55.00 pack-year smoking history. He has never used smokeless tobacco.  Past Surgical History:  Procedure Laterality Date  . CARDIAC CATHETERIZATION    . CORONARY ARTERY BYPASS GRAFT N/A 09/17/2016   Procedure: CORONARY ARTERY BYPASS GRAFTING (CABG), ON PUMP, TIMES THREE, USING LEFT INTERNAL MAMMARY ARTERY AND ENDOSCOPICALLY HARVESTED LEFT GREATER SAPHENOUS VEIN;  Surgeon: Melrose Nakayama, MD;  Location: [facility];  Service: Open Heart Surgery;  Laterality: N/A;  LIMA-LAD SVG-PD SVG-DIAG  . HERNIA REPAIR     . IR THORACENTESIS ASP PLEURAL SPACE W/IMG GUIDE  08/20/2017  . IR THORACENTESIS ASP PLEURAL SPACE W/IMG GUIDE  08/28/2017  . MAZE N/A 09/17/2016   Procedure: MAZE;  Surgeon: Melrose Nakayama, MD;  Location: [facility];  Service: Open Heart Surgery;  Laterality: N/A;  . MITRAL VALVE REPAIR N/A 09/17/2016   Procedure: MITRAL VALVE REPAIR (MVR);  Surgeon: Melrose Nakayama, MD;  Location: [facility];  Service: Open Heart Surgery;  Laterality: N/A;  Using Size 30 Physio II Annuloplasty Ring  . REPAIR QUADRICEPS / HAMSTRING MUSCLE Left   . TEE WITHOUT CARDIOVERSION N/A 09/17/2016   Procedure: TRANSESOPHAGEAL ECHOCARDIOGRAM (TEE);  Surgeon: Melrose Nakayama, MD;  Location: [facility];  Service: Open Heart Surgery;  Laterality: N/A;    Allergies  Allergen Reactions  . Amlodipine Swelling    Immunization History  Administered Date(s) Administered  . PFIZER SARS-COV-2 Vaccination 02/18/2019, 03/12/2019  . Pneumococcal Polysaccharide-23 07/11/2016  No family history on file.   Current Outpatient Medications:  .  apixaban (ELIQUIS) 5 MG TABS tablet, Take 5 mg by mouth daily. , Disp: , Rfl:  .  atorvastatin (LIPITOR) 40 MG tablet, Take 40 mg by mouth daily., Disp: , Rfl:  .  furosemide (LASIX) 40 MG tablet, Take 1 tablet (40 mg total) by mouth 2 (two) times daily., Disp: 60 tablet, Rfl: 5 .  lisinopril (PRINIVIL,ZESTRIL) 10 MG tablet, Take 2 tablets (20 mg total) by mouth daily., Disp: 30 tablet, Rfl: 6 .  metoprolol succinate (TOPROL-XL) 50 MG 24 hr tablet, Take 50 mg by mouth daily. Take with or immediately following a meal., Disp: , Rfl:  .  potassium chloride SA (K-DUR,KLOR-CON) 20 MEQ tablet, Take 1 tablet (20 mEq total) by mouth 2 (two) times daily., Disp: 60 tablet, Rfl: 5 .  rOPINIRole (REQUIP) 2 MG tablet, 2 mg. MAY TAKE UP TO 5 PER DAY, Disp: , Rfl:       Objective:   Vitals:   05/19/19 0855  BP: 120/78  Pulse: 80  Temp: (!) 97.2 F (36.2 C)  TempSrc: Temporal  SpO2: 97%   Weight: 192 lb (87.1 kg)  Height: 6\' 1"  (1.854 m)    Estimated body mass index is 25.33 kg/m as calculated from the following:   Height as of this encounter: 6\' 1"  (1.854 m).   Weight as of this encounter: 192 lb (87.1 kg).  @WEIGHTCHANGE @    05/19/19 0855  Weight: 192 lb (87.1 kg)     Physical Exam  General Appearance:    Alert, cooperative, no distress, appears stated age - yes , Deconditioned looking - no , OBESE  - no, Sitting on Wheelchair -  no  Head:    Normocephalic, without obvious abnormality, atraumatic  Eyes:    PERRL, conjunctiva/corneas clear,  Ears:    Normal TM's and external ear canals, both ears  Nose:   Nares normal, septum midline, erythematous mucosa ,   rhinorrhea , no sinus tenderness.   Throat:   Lips, mucosa, and tongue normal; teeth and gums normal. Cyanosis on lips - no  Neck:   Supple, symmetrical, trachea midline, no adenopathy;    thyroid:  no enlargement/tenderness/nodules; no carotid   bruit or JVD  Back:     Symmetric, no curvature, ROM normal, no CVA tenderness  Lungs:     Distress - no , Wheeze no, Barrell Chest - no, Purse lip breathing - no, Crackles - no .  Chest Wall:    No tenderness or deformity.    Heart:    Regular rate and rhythm, S1 and S2 normal, no rub   or gallop, Murmur - no  Breast Exam:    NOT DONE  Abdomen:     Soft, non-tender, bowel sounds active all four quadrants,    no masses, no organomegaly. Visceral obesity - no  Genitalia:   NOT DONE  Rectal:   NOT DONE  Extremities:   Extremities - normal, Has Cane - no, Clubbing - no, Edema - no  Pulses:   2+ and symmetric all extremities  Skin:   Stigmata of Connective Tissue Disease - no  Lymph nodes:   Cervical, supraclavicular, and axillary nodes normal  Psychiatric:  Neurologic:   Pleasant - yes, Anxious - no, Flat affect - no  CAm-ICU - neg, Alert and Oriented x 3 - yes, Moves all 4s - yes, Speech - normal, Cognition - intact  Assessment:      History of pleural effusion after CABG. Patient improved after stopping amlodipine. No rales on exam. Will order a CT to evaluate for dyspnea and history of pleural effusion. Last chest xray January of last year. Patient has a significant smoking history , but quit greater than 20 years ago. He does not qualify for lung cancer screening.   Post nasal Drip - Fluticasone PRN     Plan:     There are no Patient Instructions on file for this visit.   SIGNATURE   Thurmon Fair, MD PGY1

## 2019-05-19 NOTE — Patient Instructions (Addendum)
ICD-10-CM   1. Post-nasal drip  R09.82 fluticasone (FLONASE) 50 MCG/ACT nasal spray  2. Pleural effusion, not elsewhere classified  J90 CT Chest Wo Contrast  3. Shortness of breath  R06.02      Post-nasal drip - Plan: fluticasone (FLONASE) 50 MCG/ACT nasal spray  Plan -  take generic fluticasone inhaler 2 squirts each nostril daily  .mrt   Pleural effusion, not elsewhere classified - Plan: CT Chest Wo Contrast Shortness of breath  Plan  - We will order CT Scan of your chest to insure your pleural effusion are resolved and see if there are reasons for any residual shortness of breath .   Follow up  -  in 9 months or sooner if needed

## 2019-05-30 ENCOUNTER — Ambulatory Visit (HOSPITAL_BASED_OUTPATIENT_CLINIC_OR_DEPARTMENT_OTHER)
Admission: RE | Admit: 2019-05-30 | Discharge: 2019-05-30 | Disposition: A | Discharge status: Home / Self Care | Payer: Medicare Other | Source: Ambulatory Visit | Attending: Internal Medicine | Admitting: Internal Medicine

## 2019-05-30 ENCOUNTER — Other Ambulatory Visit: Payer: Self-pay

## 2019-05-30 DIAGNOSIS — J90 Pleural effusion, not elsewhere classified: Secondary | ICD-10-CM

## 2019-06-09 ENCOUNTER — Telehealth: Payer: Self-pay | Admitting: Internal Medicine

## 2019-06-09 NOTE — Telephone Encounter (Signed)
pls call him with ct chest results  - there is still rt pleural effusion with atelectasis - but is improved overall  - no evidence of cancer, penumonia or fibrosis which is good news - however there is some emphysema  Plan  - he can try Please start spiriva respimat 2 puff daily - and albuterol prn  - ROV 6-9 months

## 2019-06-10 MED ORDER — SPIRIVA RESPIMAT 1.25 MCG/ACT IN AERS: 2.0000 | INHALATION_SPRAY | Freq: Every day | RESPIRATORY_TRACT | 0 refills | Status: AC

## 2019-06-10 MED ORDER — ALBUTEROL SULFATE HFA 108 (90 BASE) MCG/ACT IN AERS: 2.0000 | INHALATION_SPRAY | Freq: Four times a day (QID) | RESPIRATORY_TRACT | 3 refills | Status: AC | PRN

## 2019-06-10 NOTE — Telephone Encounter (Signed)
Spoke with patient about CT results from Dr. Marchelle Gearing. RX has been sent to [facility] per patient for Spiriva 1.25 and rescue inhaler. Recall placed for 6 month follow up. Nothing further needed at this time.

## 2019-06-10 NOTE — Telephone Encounter (Signed)
Attempted to call pt but unable to reach. Left message for pt to return call. 

## 2019-08-12 ENCOUNTER — Other Ambulatory Visit: Payer: Self-pay | Admitting: Internal Medicine

## 2019-08-12 DIAGNOSIS — R0982 Postnasal drip: Secondary | ICD-10-CM

## 2019-10-19 IMAGING — CR DG CHEST 1V
1 series · 1 of 1 positions shown · non-contrast
Comparison: 08/04/2017

CLINICAL DATA: Post thoracentesis right

EXAM:
CHEST  1 VIEW

[w chest pa]
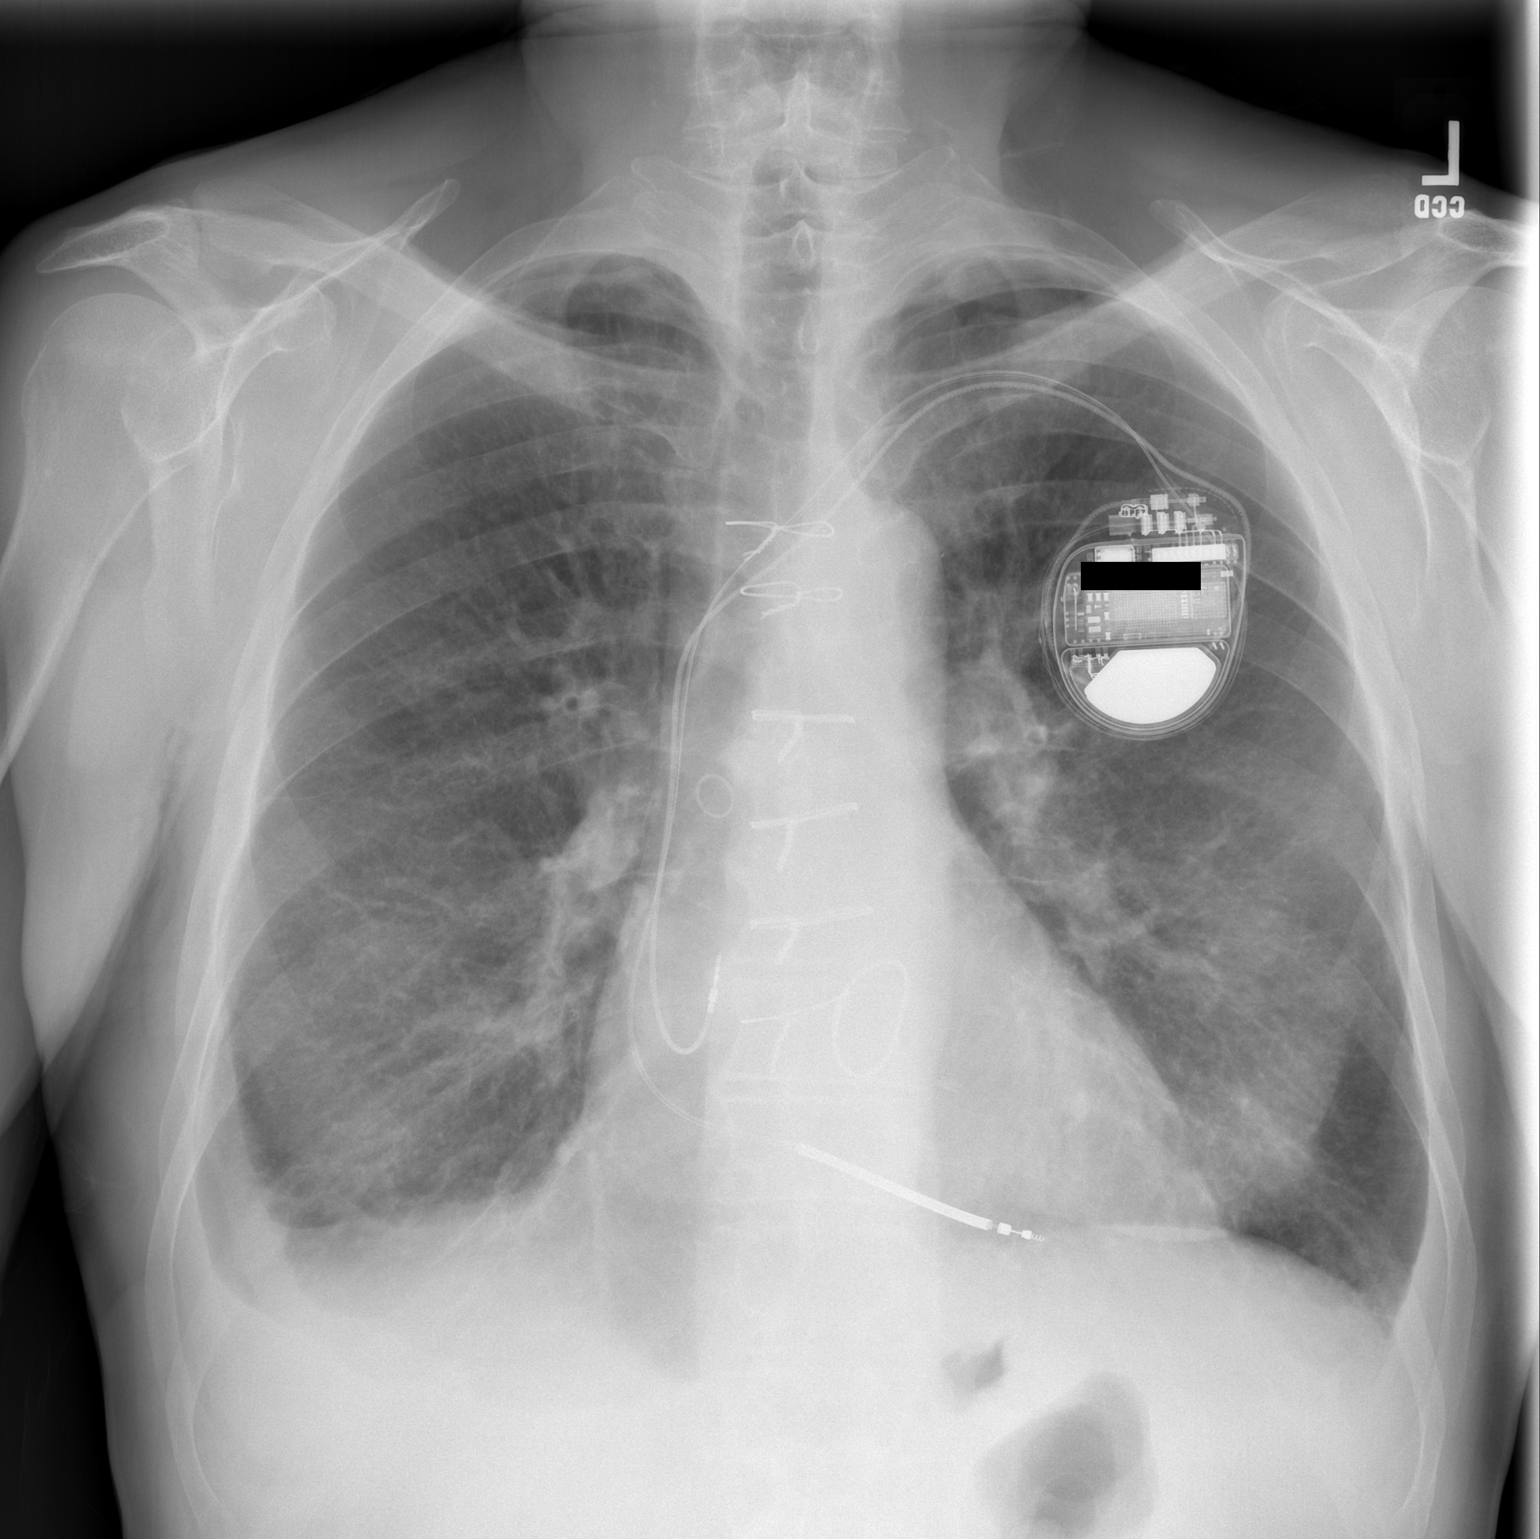

[1 of 1 positions shown; findings below may reference images not displayed]

FINDINGS: Interval improvement in right pleural effusion which is now small.
Improved aeration in the right lung base. No pneumothorax

Dual lead pacemaker unchanged with AICD. Mitral valve replacement.
Small left effusion. Improvement in interstitial edema.
IMPRESSION: Negative for pneumothorax post right thoracentesis. Improvement in
pulmonary edema. No change small left effusion.

## 2019-10-29 IMAGING — US IR THORACENTESIS ASP PLEURAL SPACE W/IMG GUIDE
1 series · 3 of 3 positions shown · non-contrast
Comparison: none

INDICATION: Shortness of breath. Recurrent right pleural effusion. Request for
diagnostic and therapeutic thoracentesis up to 2 L max.

[Series 1: ir (id) (id)/(id)/(id) ir · 3 of 3 slices shown]
[im 1/3]
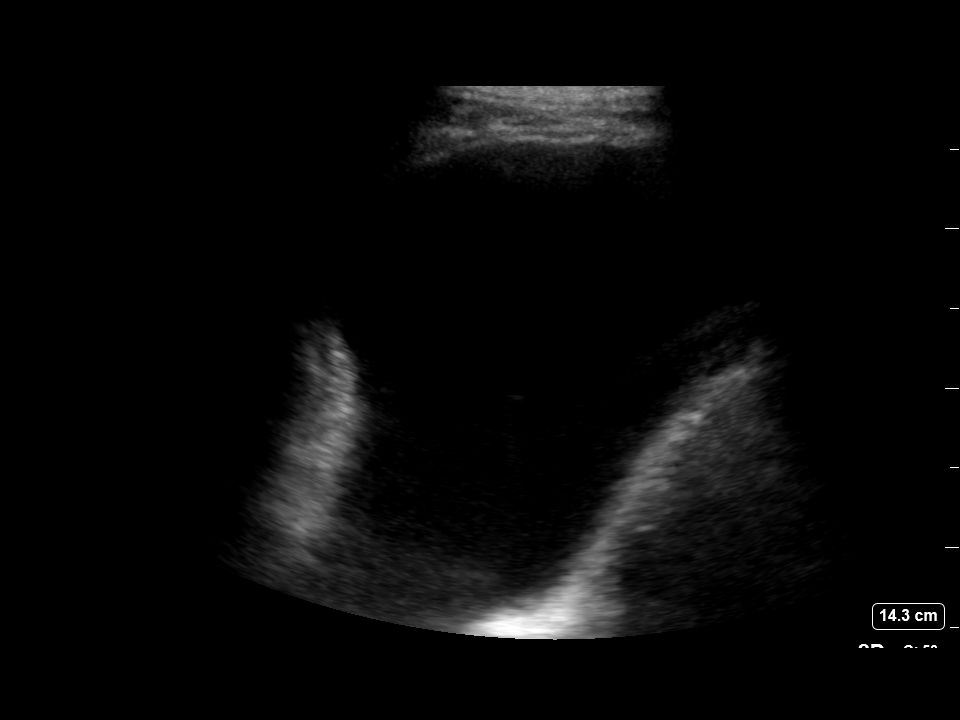
[im 2/3]
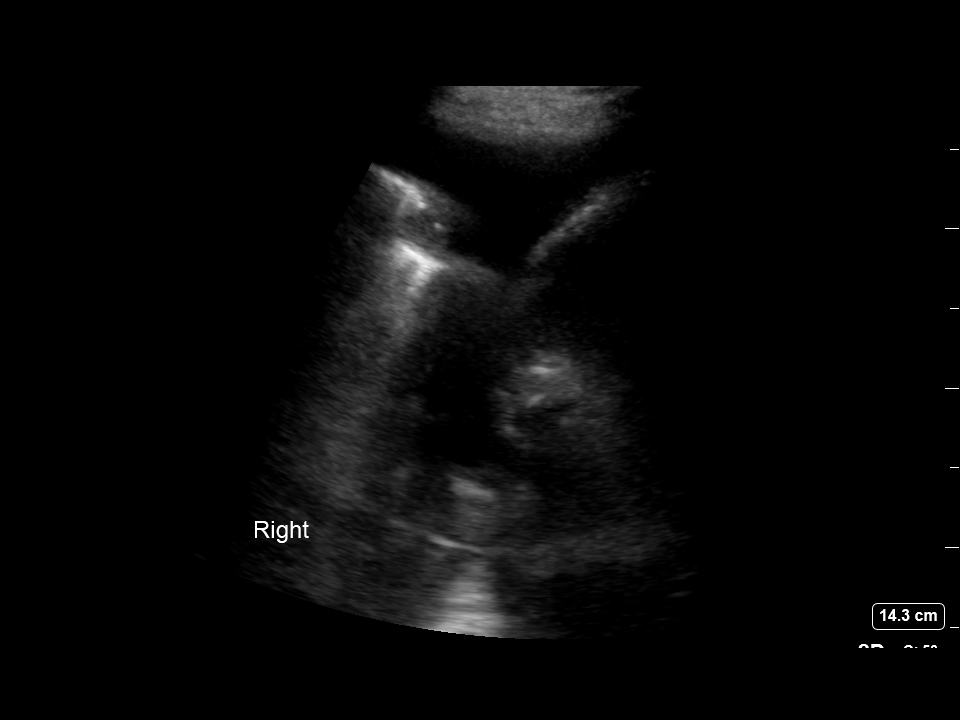
[im 3/3]
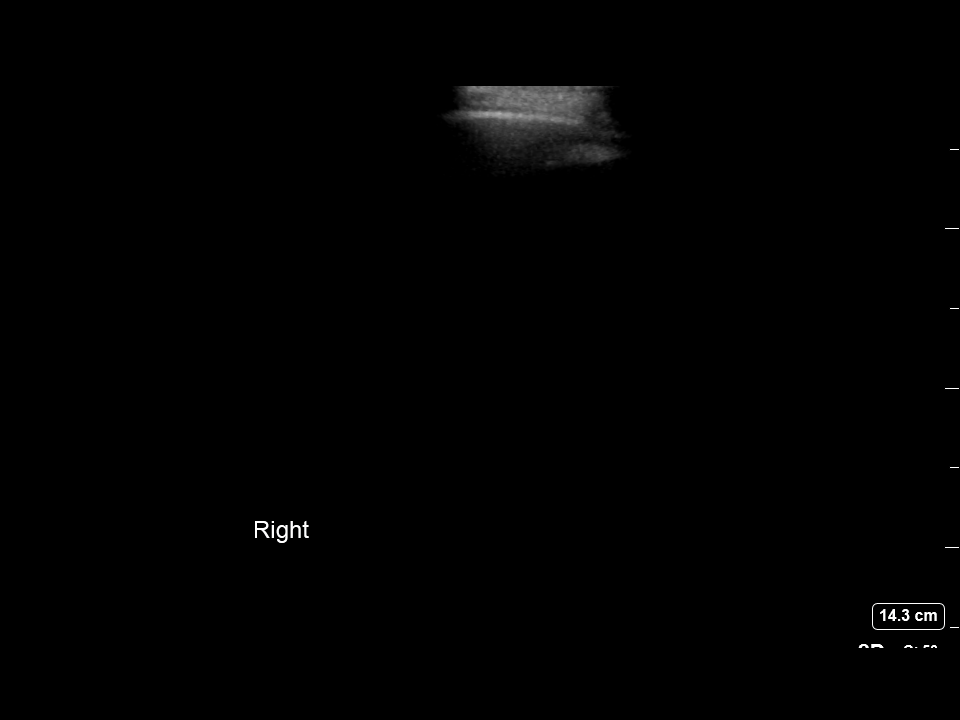

[3 of 3 positions shown; findings below may reference images not displayed]

EXAM:
ULTRASOUND GUIDED RIGHT THORACENTESIS

MEDICATIONS:
None.

COMPLICATIONS:
None immediate.

PROCEDURE:
An ultrasound guided thoracentesis was thoroughly discussed with the
patient and questions answered. The benefits, risks, alternatives
and complications were also discussed. The patient understands and
wishes to proceed with the procedure. Written consent was obtained.

Ultrasound was performed to localize and mark an adequate pocket of
fluid in the right chest. The area was then prepped and draped in
the normal sterile fashion. 1% Lidocaine was used for local
anesthesia. Under ultrasound guidance a 6 Fr Safe-T-Centesis
catheter was introduced. Thoracentesis was performed. The catheter
was removed and a dressing applied.
FINDINGS: A total of approximately 2 L of hazy, amber/red fluid was removed.
Samples were sent to the laboratory as requested by the clinical
team.
IMPRESSION: Successful ultrasound guided right thoracentesis yielding 2 L of
pleural fluid.

No pneumothorax on follow-up radiography.

## 2019-12-22 ENCOUNTER — Ambulatory Visit: Payer: Medicare Other | Admitting: Internal Medicine

## 2019-12-22 ENCOUNTER — Ambulatory Visit (INDEPENDENT_AMBULATORY_CARE_PROVIDER_SITE_OTHER): Payer: Medicare Other

## 2019-12-22 ENCOUNTER — Encounter: Payer: Self-pay | Admitting: Internal Medicine

## 2019-12-22 ENCOUNTER — Other Ambulatory Visit: Payer: Self-pay

## 2019-12-22 VITALS — BP 120/80 | HR 77 | Temp 97.5°F | Ht 75.0 in | Wt 192.1 lb

## 2019-12-22 DIAGNOSIS — J90 Pleural effusion, not elsewhere classified: Secondary | ICD-10-CM

## 2019-12-22 DIAGNOSIS — R911 Solitary pulmonary nodule: Secondary | ICD-10-CM

## 2019-12-22 NOTE — Patient Instructions (Addendum)
ICD-10-CM   1. Pleural effusion, not elsewhere classified  J90    Unclear reason for worsening pleural effusion at the outside health system late October early November 2021  Glad you are feeling nearly at baseline at this point but current status of fluid is not known  Plan -Repeat chest x-ray two-view    -if the fluid is stable we will continue to monitor and will get a CT scan of the chest in the spring 2022 to follow-up previous fluid and nodules   -However if the fluid is getting worse then we will get another thoracentesis done  Follow-up  -Based on chest x-ray results 20

## 2019-12-22 NOTE — Progress Notes (Signed)
OV 09/03/2017  Chief Complaint  Patient presents with  . Follow-up    Pt is here to follow up on recent thoracentesis he had done and also to find out on recent lab work. Pt stated after the thoracentesis, he had a stent taken care of last Wednesday and since then, pt states he is feeling better. Pt denies any current complaints of cough, SOB, or CP.   74 year old male with recurrent right pleural effusion. I last saw him in July 2019. Since then he's had 2 or 3 thoracentesis. 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 There was some lymphocytes but flow cytometry did not show any monoclonal population. He had a TSH autoimmune workup and cytology all negative or nondiagnostic. He's had CT chest and alsoabdominal ultrasound that did not show any cirrhosis other than slightly enlarged spleen. So at this point I recommended he stop his amlodipine which is known to cause pedal edema/fluid retention. He says he stopped this one week ago and is beginning to feel better. He says normally at this time he would be significantly dyspneic. He thinks the amlodipine might be the cause of his pleural effusion although review of the literature suggests that it is not known to be associated with pleural effusion. In the interim he's had a peripheral arterial disease stent removed from his left lower extremity artery not otherwise specified. And that is helping with his claudication.There is no chest pain or any other issues.   OV 10/05/2017  Subjective:  Patient ID: CAMARA ROSANDER, male , DOB: February 15, 1945 , age 42 y.o. , MRN: [id number] , ADDRESS: [address] [place] [address]   10/05/2017 -   Chief Complaint  Patient presents with  . Follow-up    Pt states he has been doing good since last visit. states since the amlodopine was discontinued, he has been able to do a lot of activities that he was not able to do before. States his breathing has been  doing better than before.     HPI MJ WILLIS 74 y.o. - has recurrent right greater than left bilateral pleural effusion. Significant problems in the right. They idiopathic and transudative. He has had multiple thoracentesis. Finally stopped amlodipine. He presents now for follow-up. He tells me that after stopping amlodipine he started feeling better. There is less fluid retention. His dyspnea is much improved. He is able to do a lot more activities. He still does have some dyspnea on exertionbut it is nowhere as significantly worse as it was. His last chest x-ray was over a month ago He does not want another chest x-ray today. He is seeing thoracic surgery tomorrow for routine follow-up and will have his chest x-ray that. He also deferred flu shot.   OV 10/27/17 - acute shortness of breath Patient presents today with shortness of breath x 3 days. States that symptoms are slightly worsening. He is concerned that his pleural effusions have worsened again. He is having a harder time with ADL's. He denies any chest pain or fever. Denies cough. He has been compliant with current medications.    OV 11/02/2017  Subjective:  Patient ID: THESEUS BIRNIE, male , DOB: 1945-09-05 , age 59 y.o. , MRN: [id number] , ADDRESS: [address] [place] [address]   11/02/2017 -   Chief Complaint  Patient presents with  . Follow-up    states his breathing continues at his baseline, no new  concerns. Needs clarification on Lasix.      HPI MARLOW BERENGUER 74 y.o. -returns for follow-up of his pleural effusions right greater than left.  He still tells me that after stopping amlodipine he is significantly better but is still some residual dyspnea.  1 week ago he saw my nurse practitioner for some worsening although he himself states he was not getting worse.  But clearly the visit was for worsening.  Increase his Lasix.  He feels back to baseline.  He feels his effusions might not be present but chest x-ray  from 1 week ago that I personally visualized is the same since August 2019.  Nevertheless after stopping amlodipine he is not having had any more thoracentesis.  And this is a significant improvement.  However the still residual pleural effusion.  He really wants to try an active course of prednisone because apparently this helped him in the past.  He refused to have a flu shot  OV 05/19/2019  Subjective:  Patient ID: DAVIDJAMES BLANSETT, male , DOB: 06-Jan-1946 , age 89 y.o. , MRN: [id number] , ADDRESS: [address] [place] [address]   05/19/2019 -   Chief Complaint  Patient presents with  . Follow-up    Pt states he has been doing good since last visit and denies any complaints.     HPI UMBERTO PAVEK 74 y.o. -presents for follow-up of his pleural effusion. Overall he is doing well since his last visit. He has mild SOB occasionally, denies productive cough or sputum production. He has been outdoors a lot and sounds like he has upper airway congestion on exam. He has not tried any allergy medication and denies a history of allergies.    OV 12/22/2019  Subjective:  Patient ID: AUDWIN SEMPER, male , DOB: 1945-02-23 , age 49 y.o. , MRN: [id number] , ADDRESS: [address] [place] [address] PCP Lenis Dickinson, Marrian Salvage, MD Patient Care Team: Jerlyn Ly, MD as PCP - General (Family Medicine)  This Provider for this visit: Treatment Team:  Attending Provider: Kalman Shan, MD    12/22/2019 -   Chief Complaint  Patient presents with  . Follow-up    PE, doing well   Follow-up recurrent pleural effusion  HPI FLOYDE DINGLEY 74 y.o. -presents for follow-up.  Last seen in the spring 2021.  He says after that he was doing well but end of October early November 2020 when he got admitted with worsening shortness of breath.  Review of the primary care notes after the discharge shows that he was treated for worsening pleural effusion and given Lasix.  He does not know if Lasix is  helped but nevertheless he feels back to his baseline.  He is frustrated that he had worsening pleural effusion.  He does not know the results of thoracentesis.  I could not get to the hospital part of the records.  He likes to have a chest x-ray to recheck the fluids.  Of note he has a 3 mm nodule which would require a CT scan of the chest in May 2022 1 year follow-up  I personally visualized the most recent CT scan and x-ray in our system.  Last thoracentesis in our system was over 2 years ago  CT chest may 2021   IMPRESSION: 1. Moderate right pleural effusion seen previously has decreased in the interval and is now small in size with anterior loculation. 2. Interval progression of chronic atelectasis/scarring anterior right lung  towards the base and posterior left lung base. 3. New tiny left lower lobe pulmonary nodules measure up to 3 mm. No follow-up needed if patient is low-risk (and has no known or suspected primary neoplasm). Non-contrast chest CT can be considered in 12 months if patient is high-risk. This recommendation follows the consensus statement: Guidelines for Management of Incidental Pulmonary Nodules Detected on CT Images: From the Fleischner Society 2017; Radiology 2017; 284:228-243. 4. Aortic Atherosclerosis (ICD10-I70.0) and Emphysema (ICD10-J43.9).   Electronically Signed   By: Kennith Center M.D.   On: 05/30/2019 08:26 ROS - per HPI     has a past medical history of A-fib (HCC), CAD, multiple vessel, Cardiomyopathy (HCC), Dysrhythmia, Hyperlipidemia, Hypertension, Mitral regurgitation (07/30/2016), PAD (peripheral artery disease) (HCC), and S/P cardiac cath (07/30/2016).   reports that he quit smoking about 20 years ago. His smoking use included cigarettes. He has a 55.00 pack-year smoking history. He has never used smokeless tobacco.  Past Surgical History:  Procedure Laterality Date  . CARDIAC CATHETERIZATION    . CORONARY ARTERY BYPASS GRAFT N/A  09/17/2016   Procedure: CORONARY ARTERY BYPASS GRAFTING (CABG), ON PUMP, TIMES THREE, USING LEFT INTERNAL MAMMARY ARTERY AND ENDOSCOPICALLY HARVESTED LEFT GREATER SAPHENOUS VEIN;  Surgeon: Loreli Slot, MD;  Location: [facility] OR;  Service: Open Heart Surgery;  Laterality: N/A;  LIMA-LAD SVG-PD SVG-DIAG  . HERNIA REPAIR    . IR THORACENTESIS ASP PLEURAL SPACE W/IMG GUIDE  08/20/2017  . IR THORACENTESIS ASP PLEURAL SPACE W/IMG GUIDE  08/28/2017  . MAZE N/A 09/17/2016   Procedure: MAZE;  Surgeon: Loreli Slot, MD;  Location: [facility] OR;  Service: Open Heart Surgery;  Laterality: N/A;  . MITRAL VALVE REPAIR N/A 09/17/2016   Procedure: MITRAL VALVE REPAIR (MVR);  Surgeon: Loreli Slot, MD;  Location[facility];  Service: Open Heart Surgery;  Laterality: N/A;  Using Size 30 Physio II Annuloplasty Ring  . REPAIR QUADRICEPS / HAMSTRING MUSCLE Left   . TEE WITHOUT CARDIOVERSION N/A 09/17/2016   Procedure: TRANSESOPHAGEAL ECHOCARDIOGRAM (TEE);  Surgeon: Loreli Slot, MD;  Location: [facility] OR;  Service: Open Heart Surgery;  Laterality: N/A;    Allergies  Allergen Reactions  . Amlodipine Swelling    Immunization History  Administered Date(s) Administered  . PFIZER SARS-COV-2 Vaccination 02/18/2019, 03/12/2019, 10/14/2019  . Pneumococcal Polysaccharide-23 07/11/2016    History reviewed. No pertinent family history.   Current Outpatient Medications:  .  albuterol (VENTOLIN HFA) 108 (90 Base) MCG/ACT inhaler, Inhale 2 puffs into the lungs every 6 (six) hours as needed for wheezing or shortness of breath. (Patient not taking: Reported on 12/22/2019), Disp: 18 g, Rfl: 3 .  apixaban (ELIQUIS) 5 MG TABS tablet, Take 5 mg by mouth 2 (two) times daily., Disp: , Rfl:  .  atorvastatin (LIPITOR) 40 MG tablet, Take 40 mg by mouth daily., Disp: , Rfl:  .  ferrous sulfate 325 (65 FE) MG tablet, Take by mouth., Disp: , Rfl:  .  fluticasone (FLONASE) 50 MCG/ACT nasal spray, Place 1 spray into both  nostrils daily. (Patient not taking: Reported on 12/22/2019), Disp: 16 g, Rfl: 2 .  furosemide (LASIX) 40 MG tablet, Take 1 tablet (40 mg total) by mouth 2 (two) times daily., Disp: 60 tablet, Rfl: 5 .  lisinopril (PRINIVIL,ZESTRIL) 10 MG tablet, Take 2 tablets (20 mg total) by mouth daily., Disp: 30 tablet, Rfl: 6 .  metoprolol succinate (TOPROL-XL) 50 MG 24 hr tablet, Take 50 mg by mouth daily. Take with or immediately  following a meal., Disp: , Rfl:  .  potassium chloride SA (K-DUR,KLOR-CON) 20 MEQ tablet, Take 1 tablet (20 mEq total) by mouth 2 (two) times daily., Disp: 60 tablet, Rfl: 5 .  rOPINIRole (REQUIP) 2 MG tablet, 2 mg. MAY TAKE UP TO 5 PER DAY, Disp: , Rfl:  .  Tiotropium Bromide Monohydrate (SPIRIVA RESPIMAT) 1.25 MCG/ACT AERS, Inhale 2 puffs into the lungs daily. (Patient not taking: Reported on 12/22/2019), Disp: 4 g, Rfl: 0      Objective:   Vitals:   12/22/19 1601  BP: 120/80  Pulse: 77  Temp: (!) 97.5 F (36.4 C)  TempSrc: Oral  SpO2: 98%  Weight: 192 lb 1.6 oz (87.1 kg)  Height: 6\' 3"  (1.905 m)    Estimated body mass index is 24.01 kg/m as calculated from the following:   Height as of this encounter: 6\' 3"  (1.905 m).   Weight as of this encounter: 192 lb 1.6 oz (87.1 kg).  @WEIGHTCHANGE @  [employer]   12/22/19 1601  Weight: 192 lb 1.6 oz (87.1 kg)     Physical Exam  General: No distress. Looks well Neuro: Alert and Oriented x 3. GCS 15. Speech normal Psych: Pleasant Resp:  Barrel Chest - no.  Wheeze - no, Crackles - no, No overt respiratory distress CVS: Normal heart sounds. Murmurs - no Ext: Stigmata of Connective Tissue Disease - no HEENT: Normal upper airway. PEERL +. No post nasal drip        Assessment:       ICD-10-CM   1. Pleural effusion, not elsewhere classified  J90 DG Chest 2 View       Plan:     Patient Instructions     ICD-10-CM   1. Pleural effusion, not elsewhere classified  J90    Unclear reason for worsening  pleural effusion at the outside health system late October early November 2021  Glad you are feeling nearly at baseline at this point but current status of fluid is not known  Plan -Repeat chest x-ray two-view    -if the fluid is stable we will continue to monitor and will get a CT scan of the chest in the spring 2022 to follow-up previous fluid and nodules   -However if the fluid is getting worse then we will get another thoracentesis done  Follow-up  -Based on chest x-ray results 20     SIGNATURE    Dr. Kalman ShanMurali Moussa Wiegand, M.D., F.C.C.P,  Pulmonary and Critical Care Medicine Staff Physician, [facility] Director - Interstitial Lung Disease  Program  Pulmonary Fibrosis [facility] [place], [place], [id number]  Pager: [phone], If no answer or between  15:00h - 7:00h: call [phone] Telephone: [phone]  4:30 PM 12/22/2019

## 2019-12-23 ENCOUNTER — Telehealth: Payer: Self-pay | Admitting: Internal Medicine

## 2019-12-23 NOTE — Telephone Encounter (Signed)
Issue has been addressed Nothing further needed at this time

## 2019-12-26 ENCOUNTER — Telehealth: Payer: Self-pay | Admitting: Internal Medicine

## 2019-12-26 DIAGNOSIS — J90 Pleural effusion, not elsewhere classified: Secondary | ICD-10-CM

## 2019-12-26 NOTE — Telephone Encounter (Signed)
Patient is requesting 12/10 cxr results.  MR please advise, thanks!

## 2019-12-27 NOTE — Telephone Encounter (Signed)
Called and spoke with pt letting him know the info stated by MR about his cxr and that we were going to have him go to IR for thoracentesis. Pt verbalized understanding. Also stated to pt that we are going to have him get labwork done 2 days after thoracentesis and he verbalized understanding. Orders have been placed. nothing further needed.

## 2019-12-27 NOTE — Telephone Encounter (Signed)
Compared to feb 2020 slight increase in pleural fluids esp on right  Plan  - The fluid removal will be done by Interventional radiology   - they will remove not more than 1.5L  - fluid labs to be sent: cell count, gram stain and culture, cytology for malignant cells, chemistries for LDH, albumin,  Protein, glucose, triglyceride and lipase  - blood labs to be sent +/- 48h of the thora: cbc, ldh, protein, albumin glucose, and lipase     IMPRESSION: 1. Slightly increased densities in the right lower chest. Findings are most compatible with underlying pleural fluid or pleural based disease. This pleural-based disease could be better characterized with ultrasound or contrast-enhanced chest CT. 2. Chronic blunting at the left costophrenic angle suggestive for small effusion or pleural thickening. 3. Prominent interstitial lung markings have minimally changed and could represent chronic changes versus mild edema.   Electronically Signed   By: Richarda Overlie M.D.   On: 12/23/2019 14:39

## 2019-12-28 ENCOUNTER — Ambulatory Visit: Payer: Medicare Other | Admitting: Internal Medicine

## 2019-12-30 ENCOUNTER — Ambulatory Visit (HOSPITAL_COMMUNITY)
Admission: RE | Admit: 2019-12-30 | Discharge: 2019-12-30 | Disposition: A | Discharge status: Home / Self Care | Payer: Medicare Other | Source: Ambulatory Visit | Attending: Internal Medicine | Admitting: Internal Medicine

## 2019-12-30 DIAGNOSIS — Z20822 Contact with and (suspected) exposure to covid-19: Secondary | ICD-10-CM | POA: Diagnosis not present

## 2019-12-30 DIAGNOSIS — Z01812 Encounter for preprocedural laboratory examination: Secondary | ICD-10-CM | POA: Insufficient documentation

## 2019-12-30 LAB — SARS CORONAVIRUS 2 (TAT 6-24 HRS): SARS Coronavirus 2: NEGATIVE

## 2020-01-02 ENCOUNTER — Other Ambulatory Visit: Payer: Self-pay

## 2020-01-02 ENCOUNTER — Ambulatory Visit (HOSPITAL_COMMUNITY)
Admission: RE | Admit: 2020-01-02 | Discharge: 2020-01-02 | Disposition: A | Discharge status: Home / Self Care | Payer: Medicare Other | Source: Ambulatory Visit | Attending: Internal Medicine | Admitting: Internal Medicine

## 2020-01-02 ENCOUNTER — Other Ambulatory Visit: Payer: Self-pay | Admitting: Internal Medicine

## 2020-01-02 DIAGNOSIS — J90 Pleural effusion, not elsewhere classified: Secondary | ICD-10-CM

## 2020-01-02 HISTORY — PX: IR THORACENTESIS ASP PLEURAL SPACE W/IMG GUIDE: IMG5380

## 2020-01-02 LAB — BODY FLUID CELL COUNT WITH DIFFERENTIAL
Eos, Fluid: 1 %
Lymphs, Fluid: 67 %
Monocyte-Macrophage-Serous Fluid: 22 % — ABNORMAL LOW (ref 50–90)
Neutrophil Count, Fluid: 10 % (ref 0–25)
Total Nucleated Cell Count, Fluid: 268 cu mm (ref 0–1000)

## 2020-01-02 LAB — PROTEIN, PLEURAL OR PERITONEAL FLUID: Total protein, fluid: <3 g/dL

## 2020-01-02 LAB — GLUCOSE, PLEURAL OR PERITONEAL FLUID: Glucose, Fluid: 116 mg/dL

## 2020-01-02 LAB — ALBUMIN, PLEURAL OR PERITONEAL FLUID: Albumin, Fluid: 1.1 g/dL

## 2020-01-02 LAB — LACTATE DEHYDROGENASE, PLEURAL OR PERITONEAL FLUID: LD, Fluid: 57 U/L — ABNORMAL HIGH (ref 3–23)

## 2020-01-02 LAB — GRAM STAIN

## 2020-01-02 LAB — AMYLASE, PLEURAL OR PERITONEAL FLUID: Amylase, Fluid: 43 U/L

## 2020-01-02 MED ORDER — LIDOCAINE HCL 1 % IJ SOLN
INTRAMUSCULAR | Status: DC | PRN
  Administered 2020-01-02: 20 mL via INTRADERMAL

## 2020-01-02 MED ORDER — LIDOCAINE HCL 1 % IJ SOLN
INTRAMUSCULAR | Status: AC
  Filled 2020-01-02: qty 20

## 2020-01-02 NOTE — Procedures (Signed)
PROCEDURE SUMMARY:  Successful US guided right thoracentesis. Yielded 1.2 L of amber fluid. Pt tolerated procedure well. No immediate complications.  Specimen was sent for labs. CXR ordered.  EBL < 5 mL  Brayton El PA-C 01/02/2020 9:32 AM

## 2020-01-03 LAB — TRIGLYCERIDES, BODY FLUIDS: Triglycerides, Fluid: <9 mg/dL

## 2020-01-04 ENCOUNTER — Telehealth: Payer: Self-pay | Admitting: Internal Medicine

## 2020-01-04 LAB — CYTOLOGY - NON PAP

## 2020-01-04 NOTE — Telephone Encounter (Signed)
Results again show leaky fluid with lot of lymphocytes  Plan  - let him know ctyology still pending   - please call lab and find out if they have enough sample to send for flow cytometry for analysis - if so please let me know and I can talk to Dr Charm Barges or Dr Laureen Ochs

## 2020-01-05 ENCOUNTER — Telehealth: Payer: Self-pay | Admitting: Internal Medicine

## 2020-01-05 NOTE — Telephone Encounter (Signed)
Incoming call from [facility] with Cytology regarding adding on labs for pleural fluid for inpatient for Dr. Marchelle Gearing patient.  Advised that Dr. Marchelle Gearing is on 25M.  She will contact the floor.  Nothing further needed.

## 2020-01-05 NOTE — Telephone Encounter (Signed)
Attempted to call pt but unable to reach. Left message for pt to return call.  Called Cytology at [facility] at [phone] and spoke with [facility] who stated that there was enough sample to be able to do a flow cytometry.  Sample has been refrigerated so we will need to get the okay from either Dr. Laureen Ochs or Dr. Charm Barges to be able to do this since the sample has been refrigerated. Corrie Dandy suggested talking with Dr. Laureen Ochs.  MR, please see if you can reach out to them and then let us know as Corrie Dandy said to call her back when we know if the okay has been given to do this.

## 2020-01-07 LAB — CULTURE, BODY FLUID W GRAM STAIN -BOTTLE: Culture: NO GROWTH

## 2020-01-09 ENCOUNTER — Other Ambulatory Visit (INDEPENDENT_AMBULATORY_CARE_PROVIDER_SITE_OTHER): Payer: Medicare Other

## 2020-01-09 DIAGNOSIS — J90 Pleural effusion, not elsewhere classified: Secondary | ICD-10-CM

## 2020-01-09 LAB — CBC WITH DIFFERENTIAL/PLATELET
Basophils Absolute: 0 10*3/uL (ref 0.0–0.1)
Basophils Relative: 0.5 % (ref 0.0–3.0)
Eosinophils Absolute: 0 10*3/uL (ref 0.0–0.7)
Eosinophils Relative: 0.6 % (ref 0.0–5.0)
HCT: 36.4 % — ABNORMAL LOW (ref 39.0–52.0)
Hemoglobin: 11.8 g/dL — ABNORMAL LOW (ref 13.0–17.0)
Lymphocytes Relative: 7.7 % — ABNORMAL LOW (ref 12.0–46.0)
Lymphs Abs: 0.5 10*3/uL — ABNORMAL LOW (ref 0.7–4.0)
MCHC: 32.5 g/dL (ref 30.0–36.0)
MCV: 83.8 fl (ref 78.0–100.0)
Monocytes Absolute: 0.8 10*3/uL (ref 0.1–1.0)
Monocytes Relative: 11.9 % (ref 3.0–12.0)
Neutro Abs: 5.1 10*3/uL (ref 1.4–7.7)
Neutrophils Relative %: 79.3 % — ABNORMAL HIGH (ref 43.0–77.0)
Platelets: 141 10*3/uL — ABNORMAL LOW (ref 150.0–400.0)
RBC: 4.34 Mil/uL (ref 4.22–5.81)
RDW: 16.8 % — ABNORMAL HIGH (ref 11.5–15.5)
WBC: 6.4 10*3/uL (ref 4.0–10.5)

## 2020-01-09 LAB — GLUCOSE, RANDOM: Glucose, Bld: 107 mg/dL — ABNORMAL HIGH (ref 70–99)

## 2020-01-09 LAB — PROTEIN, TOTAL: Total Protein: 7.2 g/dL (ref 6.0–8.3)

## 2020-01-09 LAB — LACTATE DEHYDROGENASE: LDH: 164 U/L (ref 120–250)

## 2020-01-09 LAB — ALBUMIN: Albumin: 3.9 g/dL (ref 3.5–5.2)

## 2020-01-09 LAB — LIPASE: Lipase: 15 U/L (ref 11.0–59.0)

## 2020-01-11 ENCOUNTER — Telehealth: Payer: Self-pay | Admitting: Internal Medicine

## 2020-01-11 NOTE — Telephone Encounter (Signed)
Pt is returning Elise's call - somehow got me?

## 2020-01-11 NOTE — Telephone Encounter (Signed)
[name] Shan, MD   6:11 AM Note Results again show leaky fluid with lot of lymphocytes  Plan  - let him know ctyology still pending   - please call lab and find out if they have enough sample to send for flow cytometry for analysis - if so please let me know and I can talk to Dr Charm Barges or Dr Darlis Loan with the pt and notified of results per MR  He wants to know if the cytology has been reviewed yet  Please advise thanks

## 2020-01-11 NOTE — Telephone Encounter (Signed)
lmtcb for pt.  

## 2020-01-20 NOTE — Telephone Encounter (Signed)
Cytology again negative for malignant cells  Pleas leet him know next time will get a flow cyytometry if we do a tap - to look athe fluid cells a bit differently +/- refer [facility] for opinion at [facility]

## 2020-01-20 NOTE — Telephone Encounter (Signed)
Lm for patient.  

## 2020-01-20 NOTE — Telephone Encounter (Signed)
Refrigerated sample wotn work. I will address flow cytometry at time of next tap if there is one and mostly likely tehre will be

## 2020-02-03 NOTE — Telephone Encounter (Signed)
lmtcb X2 for pt.  

## 2020-02-09 NOTE — Telephone Encounter (Signed)
MR please advise when pt is to follow up.   If cant reach pt then a letter will need to be sent to pt's home to call for results and schedule follow up visit.

## 2020-02-10 ENCOUNTER — Encounter: Payer: Self-pay | Admitting: *Deleted

## 2020-02-10 NOTE — Telephone Encounter (Signed)
Give him followup in 4 monthhs

## 2020-02-10 NOTE — Telephone Encounter (Signed)
Tried calling pt  Still no answer- LMTCB, 4 month recall placed and reminder placed

## 2020-02-21 ENCOUNTER — Telehealth: Payer: Self-pay | Admitting: Internal Medicine

## 2020-02-21 NOTE — Telephone Encounter (Signed)
Left message for patient to call back  

## 2020-02-28 NOTE — Telephone Encounter (Signed)
Called and spoke to pt. Informed him of the results and recs per MR from the phone note from 12.29.21. Pt verbalized understanding and denied any further questions or concerns at this time.

## 2020-05-30 ENCOUNTER — Ambulatory Visit (HOSPITAL_BASED_OUTPATIENT_CLINIC_OR_DEPARTMENT_OTHER)
Admission: RE | Admit: 2020-05-30 | Discharge: 2020-05-30 | Disposition: A | Discharge status: Home / Self Care | Payer: Medicare Other | Source: Ambulatory Visit | Attending: Internal Medicine | Admitting: Internal Medicine

## 2020-05-30 ENCOUNTER — Other Ambulatory Visit: Payer: Self-pay

## 2020-05-30 DIAGNOSIS — R911 Solitary pulmonary nodule: Secondary | ICD-10-CM | POA: Diagnosis not present

## 2020-07-09 NOTE — Progress Notes (Signed)
Sorry for delay with results. Moderate loculared Right pleural effusion continues. PLAN - arrange first avail followup 15 min.

## 2020-08-31 ENCOUNTER — Ambulatory Visit: Payer: Medicare Other | Admitting: Internal Medicine

## 2020-08-31 ENCOUNTER — Encounter: Payer: Self-pay | Admitting: Internal Medicine

## 2020-08-31 ENCOUNTER — Other Ambulatory Visit: Payer: Self-pay

## 2020-08-31 VITALS — BP 118/60 | HR 72 | Temp 98.7°F | Ht 73.0 in | Wt 192.4 lb

## 2020-08-31 DIAGNOSIS — J90 Pleural effusion, not elsewhere classified: Secondary | ICD-10-CM | POA: Diagnosis not present

## 2020-08-31 NOTE — Patient Instructions (Addendum)
ICD-10-CM   1. Pleural effusion, not elsewhere classified  J90     2. Pleural effusion on right  J90       On clinical exam pleural effusion continues to persist.  Most recent scan in May 2022 it was still there.  On physical exam 08/31/2020 pleural effusion still persist.   Plan - Took shared decision making to clinically follow -Discussed options about Pleurx catheter or pleurodesis but agreed these 2 are not the right suitable options  Follow-up - Return in 6 months or sooner if needed; chest x-ray at follow-up visit

## 2020-08-31 NOTE — Progress Notes (Signed)
OV 09/03/2017  Chief Complaint  Patient presents with   Follow-up    Pt is here to follow up on recent thoracentesis he had done and also to find out on recent lab work. Pt stated after the thoracentesis, he had a stent taken care of last Wednesday and since then, pt states he is feeling better. Pt denies any current complaints of cough, SOB, or CP.   75 year old male with recurrent right pleural effusion. I last saw him in July 2019. Since then he's had 2 or 3 thoracentesis. 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 There was some lymphocytes but flow cytometry did not show any monoclonal population. He had a TSH autoimmune workup and cytology all negative or nondiagnostic. He's had CT chest and alsoabdominal ultrasound that did not show any cirrhosis other than slightly enlarged spleen. So at this point I recommended he stop his amlodipine which is known to cause pedal edema/fluid retention. He says he stopped this one week ago and is beginning to feel better. He says normally at this time he would be significantly dyspneic. He thinks the amlodipine might be the cause of his pleural effusion although review of the literature suggests that it is not known to be associated with pleural effusion. In the interim he's had a peripheral arterial disease stent removed from his left lower extremity artery not otherwise specified. And that is helping with his claudication.There is no chest pain or any other issues.   OV 10/05/2017  Subjective:  Patient ID: [name] Green, male , DOB: [birth year] , age 70 y.o. , MRN: [id number] , ADDRESS: [address] [place] [address]   10/05/2017 -   Chief Complaint  Patient presents with   Follow-up    Pt states he has been doing good since last visit. states since the amlodopine was discontinued, he has been able to do a lot of activities that he was not able to do before. States his breathing has been doing  better than before.     HPI [name] Green 75 y.o. - has recurrent right greater than left bilateral pleural effusion. Significant problems in the right. They idiopathic and transudative. He has had multiple thoracentesis. Finally stopped amlodipine. He presents now for follow-up. He tells me that after stopping amlodipine he started feeling better. There is less fluid retention. His dyspnea is much improved. He is able to do a lot more activities. He still does have some dyspnea on exertionbut it is nowhere as significantly worse as it was. His last chest x-ray was over a month ago He does not want another chest x-ray today. He is seeing thoracic surgery tomorrow for routine follow-up and will have his chest x-ray that. He also deferred flu shot.   OV 10/27/17 - acute shortness of breath Patient presents today with shortness of breath x 3 days. States that symptoms are slightly worsening. He is concerned that his pleural effusions have worsened again. He is having a harder time with ADL's. He denies any chest pain or fever. Denies cough. He has been compliant with current medications.    OV 11/02/2017  Subjective:  Patient ID: [name] Green, male , DOB: January 25, 1945 , age 57 y.o. , MRN: [id number] , ADDRESS: [address] [place] [address]   11/02/2017 -   Chief Complaint  Patient presents with   Follow-up    states his breathing continues at his baseline, no new  concerns. Needs clarification on Lasix.      HPI [name] Green 75 y.o. -returns for follow-up of his pleural effusions right greater than left.  He still tells me that after stopping amlodipine he is significantly better but is still some residual dyspnea.  1 week ago he saw my nurse practitioner for some worsening although he himself states he was not getting worse.  But clearly the visit was for worsening.  Increase his Lasix.  He feels back to baseline.  He feels his effusions might not be present but chest x-ray from 1  week ago that I personally visualized is the same since August 2019.  Nevertheless after stopping amlodipine he is not having had any more thoracentesis.  And this is a significant improvement.  However the still residual pleural effusion.  He really wants to try an active course of prednisone because apparently this helped him in the past.  He refused to have a flu shot  OV 05/19/2019  Subjective:  Patient ID: [name] Green, male , DOB: Jun 03, 1945 , age 75 y.o. , MRN: [id number] , [address]: [address] [place] [address]   05/19/2019 -   Chief Complaint  Patient presents with   Follow-up    Pt states he has been doing good since last visit and denies any complaints.     HPI [name] Green 75 y.o. -presents for follow-up of his pleural effusion. Overall he is doing well since his last visit. He has mild SOB occasionally, denies productive cough or sputum production. He has been outdoors a lot and sounds like he has upper airway congestion on exam. He has not tried any allergy medication and denies a history of allergies.    OV 12/22/2019  Subjective:  Patient ID: [name] Green, male , DOB: Jun 03, 1945 , age 75 y.o. , MRN: [id number] , ADDRESS: [address] [place] [address] PCP Lenis DickinsonHollar, Marrian SalvageLarry A, MD Patient Care Team: Jerlyn LyHollar, Larry A, MD as PCP - General (Family Medicine)  This Provider for this visit: Treatment Team:  Attending Provider: Kalman Shanamaswamy, Carine Nordgren, MD    12/22/2019 -   Chief Complaint  Patient presents with   Follow-up    PE, doing well   Follow-up recurrent pleural effusion  HPI [name] Green 75 y.o. -presents for follow-up.  Last seen in the spring 2021.  He says after that he was doing well but end of October early November 2020 when he got admitted with worsening shortness of breath.  Review of the primary care notes after the discharge shows that he was treated for worsening pleural effusion and given Lasix.  He does not know if Lasix is helped but  nevertheless he feels back to his baseline.  He is frustrated that he had worsening pleural effusion.  He does not know the results of thoracentesis.  I could not get to the hospital part of the records.  He likes to have a chest x-ray to recheck the fluids.  Of note he has a 3 mm nodule which would require a CT scan of the chest in May 2022 1 year follow-up  I personally visualized the most recent CT scan and x-ray in our system.  Last thoracentesis in our system was over 2 years ago  CT chest may 2021   IMPRESSION: 1. Moderate right pleural effusion seen previously has decreased in the interval and is now small in size with anterior loculation. 2. Interval progression of chronic atelectasis/scarring anterior right lung  towards the base and posterior left lung base. 3. New tiny left lower lobe pulmonary nodules measure up to 3 mm. No follow-up needed if patient is low-risk (and has no known or suspected primary neoplasm). Non-contrast chest CT can be considered in 12 months if patient is high-risk. This recommendation follows the consensus statement: Guidelines for Management of Incidental Pulmonary Nodules Detected on CT Images: From the Fleischner Society 2017; Radiology 2017; 284:228-243. 4. Aortic Atherosclerosis (ICD10-I70.0) and Emphysema (ICD10-J43.9).     Electronically Signed   By: Kennith Center M.D.   On: 05/30/2019 08:26 ROS - per HPI    OV 08/31/2020  Subjective:  Patient ID: [name] Green, male , DOB: Dec 26, 1945 , age 39 y.o. , MRN: [id number] , ADDRESS: [address] [place] [address] PCP Malka So., MD Patient Care Team: Malka So., MD as PCP - General (Internal Medicine)  This Provider for this visit: Treatment Team:  Attending Provider: Kalman Shan, MD    08/31/2020 -   Chief Complaint  Patient presents with   Follow-up    Breathing seem to be a little better.    Follow-up persistent right pleural effusion last  thoracentesis transudate December 2021.  Normal ultrasound abdomen August 2019 . Non-diagnostic with lymphocytes  HPI [name] Green 75 y.o.  - returns for follow-up.  Last seen in December 2021.  After that in in May 2022 he had CT scan of the chest that showed resolution of the nodule from the left side.  His right pleural effusion still persist.  He says overall he stable but still bothered by fixed dyspnea on exertion.  He is frustrated.  He says he knows the pleural effusion is still there.  We discussed options about repeat thoracentesis, repeat chest x-ray, Pleurx catheter, pleurodesis.  He does not want any of this.  He is just content following this.  There are no other new issues.   IMPRESSION: CT chest 1. Interval resolution of previous left lower lobe lung nodule compatible with a benign process. 2. Stable appearance of rounded atelectasis with calcifications within the right lower lobe and right middle lobe. 3. Moderate, partially loculated right pleural effusion, unchanged.   Aortic Atherosclerosis (ICD10-I70.0) and Emphysema (ICD10-J43.9).     Electronically Signed   By: Signa Kell M.D.   On: 05/31/2020 15:16   PFT  PFT Results Latest Ref Rng & Units 09/12/2016  FVC-Pre L 2.98  FVC-Predicted Pre % 61  FVC-Post L 3.03  FVC-Predicted Post % 62  Pre FEV1/FVC % % 62  Post FEV1/FCV % % 61  FEV1-Pre L 1.85  FEV1-Predicted Pre % 52  FEV1-Post L 1.85  DLCO uncorrected ml/min/mmHg 18.18  DLCO UNC% % 49  DLVA Predicted % 74  TLC L 6.53  TLC % Predicted % 85  RV % Predicted % 131       has a past medical history of A-fib (HCC), CAD, multiple vessel, Cardiomyopathy (HCC), Dysrhythmia, Hyperlipidemia, Hypertension, Mitral regurgitation (07/30/2016), PAD (peripheral artery disease) (HCC), and S/P cardiac cath (07/30/2016).   reports that he quit smoking about 21 years ago. His smoking use included cigarettes. He has a 55.00 pack-year smoking history. He has never used  smokeless tobacco.  Past Surgical History:  Procedure Laterality Date   CARDIAC CATHETERIZATION     CORONARY ARTERY BYPASS GRAFT N/A 09/17/2016   Procedure: CORONARY ARTERY BYPASS GRAFTING (CABG), ON PUMP, TIMES THREE, USING LEFT INTERNAL MAMMARY ARTERY AND ENDOSCOPICALLY HARVESTED LEFT GREATER SAPHENOUS VEIN;  Surgeon: Loreli Slot, MD;  Location: [facility] OR;  Service: Open Heart Surgery;  Laterality: N/A;  LIMA-LAD SVG-PD SVG-DIAG   HERNIA REPAIR     IR THORACENTESIS ASP PLEURAL SPACE W/IMG GUIDE  08/20/2017   IR THORACENTESIS ASP PLEURAL SPACE W/IMG GUIDE  08/28/2017   IR THORACENTESIS ASP PLEURAL SPACE W/IMG GUIDE  01/02/2020   MAZE N/A 09/17/2016   Procedure: MAZE;  Surgeon: Loreli Slot, MD;  Location: [facility] OR;  Service: Open Heart Surgery;  Laterality: N/A;   MITRAL VALVE REPAIR N/A 09/17/2016   Procedure: MITRAL VALVE REPAIR (MVR);  Surgeon: Loreli Slot, MD;  Location: [facility] OR;  Service: Open Heart Surgery;  Laterality: N/A;  Using Size 30 Physio II Annuloplasty Ring   REPAIR QUADRICEPS / HAMSTRING MUSCLE Left    TEE WITHOUT CARDIOVERSION N/A 09/17/2016   Procedure: TRANSESOPHAGEAL ECHOCARDIOGRAM (TEE);  Surgeon: Loreli Slot, MD;  Location: [facility] OR;  Service: Open Heart Surgery;  Laterality: N/A;    Allergies  Allergen Reactions   Amlodipine Swelling    Immunization History  Administered Date(s) Administered   PFIZER(Purple Top)SARS-COV-2 Vaccination 02/18/2019, 03/12/2019, 10/14/2019   Pneumococcal Polysaccharide-23 07/11/2016    No family history on file.   Current Outpatient Medications:    albuterol (VENTOLIN HFA) 108 (90 Base) MCG/ACT inhaler, Inhale 2 puffs into the lungs every 6 (six) hours as needed for wheezing or shortness of breath., Disp: 18 g, Rfl: 3   apixaban (ELIQUIS) 5 MG TABS tablet, Take 5 mg by mouth 2 (two) times daily., Disp: , Rfl:    atorvastatin (LIPITOR) 40 MG tablet, Take 40 mg by mouth daily., Disp: , Rfl:    ferrous sulfate  325 (65 FE) MG tablet, Take by mouth., Disp: , Rfl:    fluticasone (FLONASE) 50 MCG/ACT nasal spray, Place 1 spray into both nostrils daily., Disp: 16 g, Rfl: 2   furosemide (LASIX) 40 MG tablet, Take 1 tablet (40 mg total) by mouth 2 (two) times daily., Disp: 60 tablet, Rfl: 5   lisinopril (PRINIVIL,ZESTRIL) 10 MG tablet, Take 2 tablets (20 mg total) by mouth daily., Disp: 30 tablet, Rfl: 6   metoprolol succinate (TOPROL-XL) 50 MG 24 hr tablet, Take 50 mg by mouth daily. Take with or immediately following a meal., Disp: , Rfl:    potassium chloride SA (K-DUR,KLOR-CON) 20 MEQ tablet, Take 1 tablet (20 mEq total) by mouth 2 (two) times daily., Disp: 60 tablet, Rfl: 5   rOPINIRole (REQUIP) 2 MG tablet, 2 mg. MAY TAKE UP TO 5 PER DAY, Disp: , Rfl:    Tiotropium Bromide Monohydrate (SPIRIVA RESPIMAT) 1.25 MCG/ACT AERS, Inhale 2 puffs into the lungs daily. (Patient not taking: No sig reported), Disp: 4 g, Rfl: 0      Objective:   Vitals:   08/31/20 1400  BP: 118/60  Pulse: 72  Temp: 98.7 F (37.1 C)  TempSrc: Oral  SpO2: 96%  Weight: 192 lb 6.4 oz (87.3 kg)  Height: 6\' 1"  (1.854 m)    Estimated body mass index is 25.38 kg/m as calculated from the following:   Height as of this encounter: 6\' 1"  (1.854 m).   Weight as of this encounter: 192 lb 6.4 oz (87.3 kg).  @WEIGHTCHANGE @    08/31/20 1400  Weight: 192 lb 6.4 oz (87.3 kg)     Physical Exam    General: No distress. Looks well Neuro: Alert and Oriented x 3. GCS 15. Speech normal Psych: Pleasant Resp:  Barrel Chest -  no.  Wheeze - no, Crackles - no, No overt respiratory distress. RT base dullness + and diminished air etnry + CVS: Normal heart sounds. Murmurs - no Ext: Stigmata of Connective Tissue Disease - no HEENT: Normal upper airway. PEERL +. No post nasal drip        Assessment:       ICD-10-CM   1. Pleural effusion, not elsewhere classified  J90     2. Pleural effusion on right  J90           Plan:     Patient Instructions     ICD-10-CM   1. Pleural effusion, not elsewhere classified  J90     2. Pleural effusion on right  J90       On clinical exam pleural effusion continues to persist.  Most recent scan in May 2022 it was still there.  On physical exam 08/31/2020 pleural effusion still persist.   Plan - Took shared decision making to clinically follow -Discussed options about Pleurx catheter or pleurodesis but agreed these 2 are not the right suitable options  Follow-up - Return in 6 months or sooner if needed; chest x-ray at follow-up visit     SIGNATURE    Dr. Kalman Shan, M.D., F.C.C.P,  Pulmonary and Critical Care Medicine Staff Physician, [facility] Center Director - Interstitial Lung Disease  Program  Pulmonary Fibrosis [facility] at [facility] [place], [place], [address]  Pager: [phone], If no answer or between  15:00h - 7:00h: call [phone] Telephone: [phone]  2:20 PM 08/31/2020

## 2021-09-13 DEATH — deceased

## 2022-03-12 IMAGING — CR DG CHEST 1V
1 series · 1 of 1 positions shown · non-contrast
Comparison: 12/22/2018

CLINICAL DATA: Status post right-sided thoracentesis

EXAM:
CHEST  1 VIEW

[chest pa]
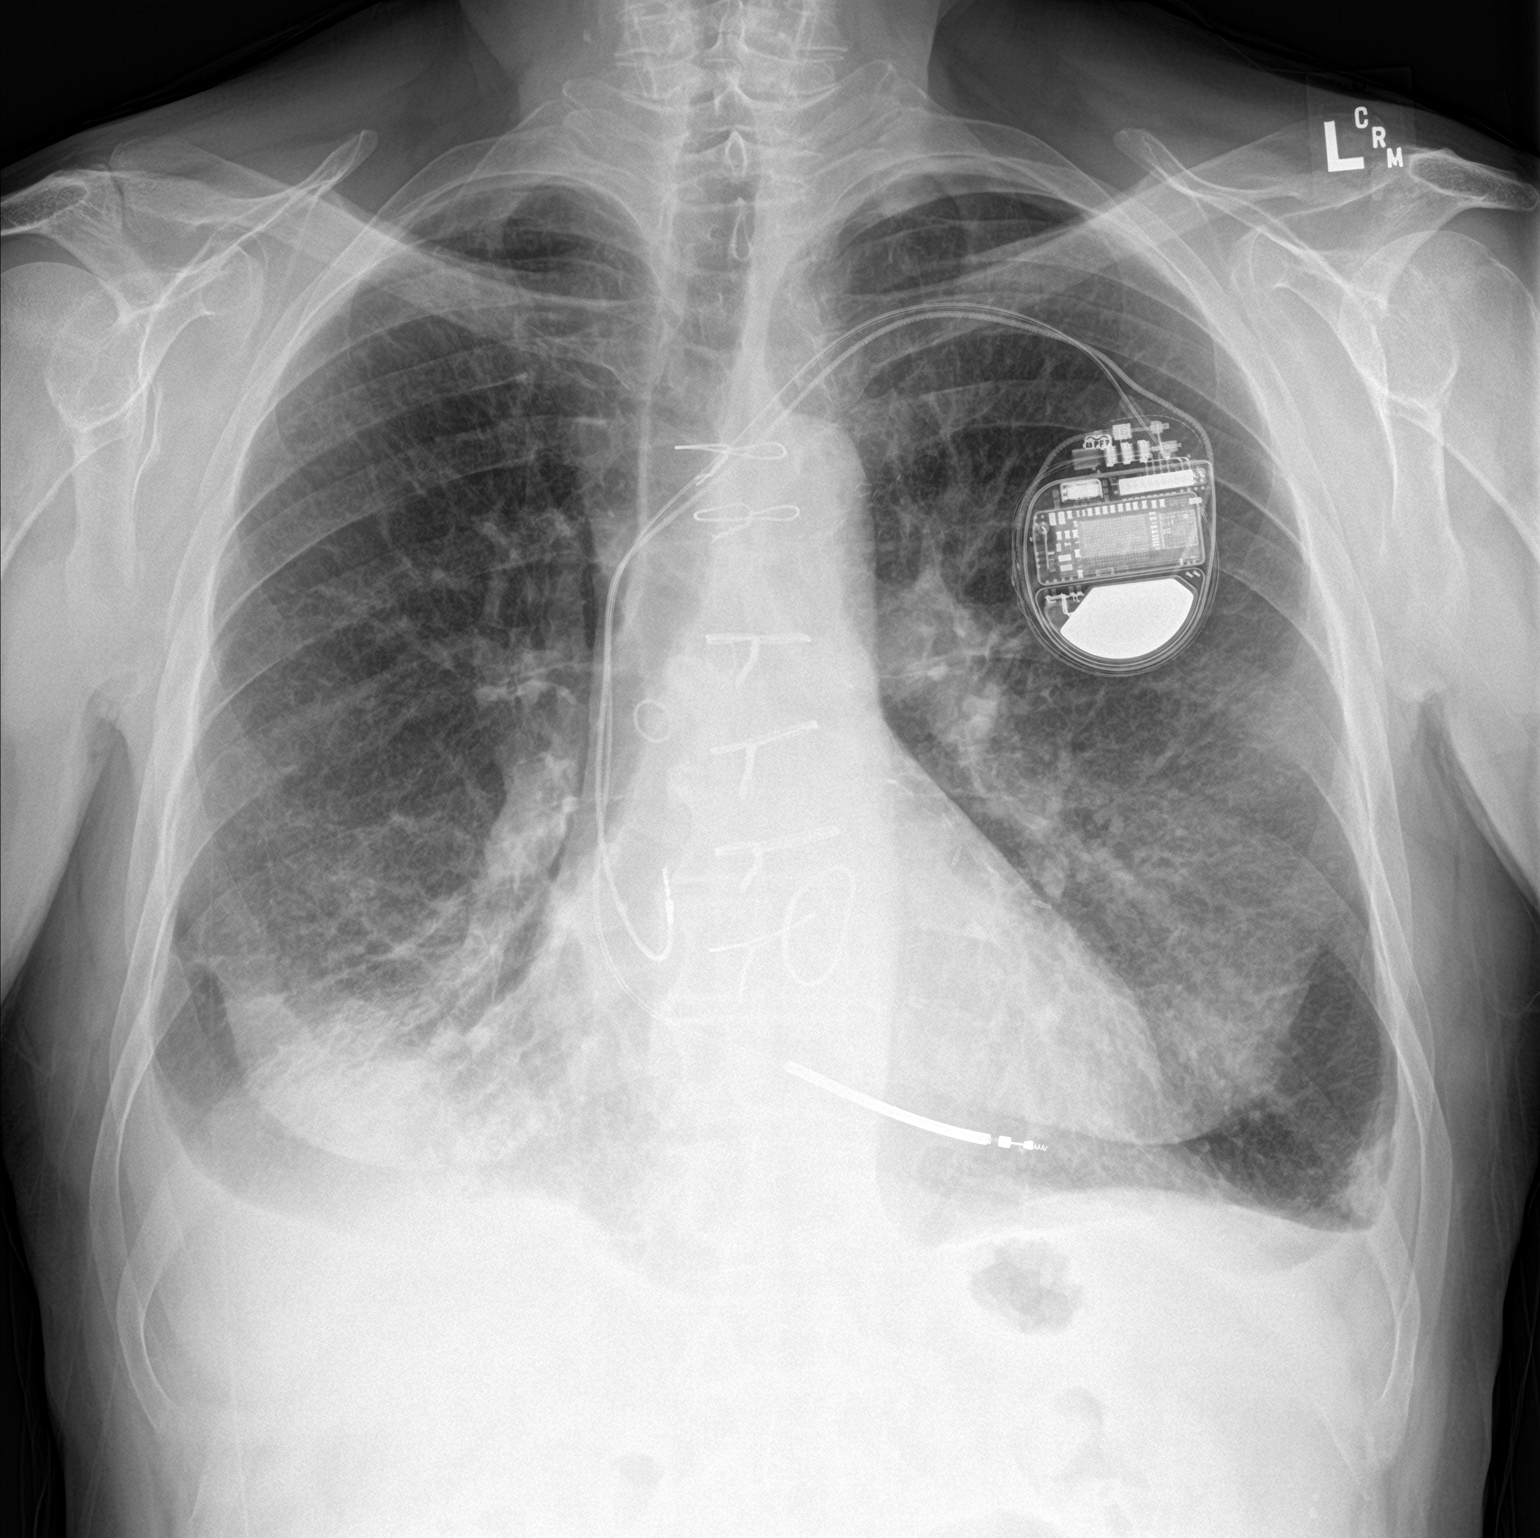

[1 of 1 positions shown; findings below may reference images not displayed]

FINDINGS: Cardiac shadow is stable. Postsurgical changes are noted.
Defibrillator is again noted. Small effusions are noted right
greater than left. The overall appearance is stable from the prior
exam. No pneumothorax following thoracentesis is noted. Mild stable
interstitial changes are again noted.
IMPRESSION: No pneumothorax following right-sided thoracentesis. Small effusions
right greater than left are again seen and stable from the prior
exam.
